# Patient Record
Sex: Male | Born: 1975
Health system: Southern US, Community
[De-identification: ages and names within clinical notes are randomized; demographics above are authoritative.]

## PROBLEM LIST (undated history)

## (undated) DIAGNOSIS — K219 Gastro-esophageal reflux disease without esophagitis: Secondary | ICD-10-CM

## (undated) DIAGNOSIS — K760 Fatty (change of) liver, not elsewhere classified: Secondary | ICD-10-CM

## (undated) DIAGNOSIS — Z87442 Personal history of urinary calculi: Secondary | ICD-10-CM

## (undated) DIAGNOSIS — I10 Essential (primary) hypertension: Secondary | ICD-10-CM

## (undated) DIAGNOSIS — G4730 Sleep apnea, unspecified: Secondary | ICD-10-CM

## (undated) HISTORY — PX: LEG SURGERY: SHX1003

## (undated) HISTORY — DX: Gastro-esophageal reflux disease without esophagitis: K21.9

---

## 2010-03-12 ENCOUNTER — Inpatient Hospital Stay (HOSPITAL_COMMUNITY)
Admission: EM | Admit: 2010-03-12 | Discharge: 2010-03-13 | Payer: Self-pay | Attending: Orthopedic Surgery | Admitting: Orthopedic Surgery

## 2010-03-12 ENCOUNTER — Emergency Department (HOSPITAL_COMMUNITY)
Admission: EM | Admit: 2010-03-12 | Discharge: 2010-03-12 | Disposition: A | Discharge status: Other Acute Inpatient Hospital | Payer: Self-pay | Source: Home / Self Care | Attending: Orthopedic Surgery | Admitting: Orthopedic Surgery

## 2010-06-05 LAB — GLUCOSE, CAPILLARY: Glucose-Capillary: 93 mg/dL (ref 70–99)

## 2010-06-05 LAB — POCT I-STAT, CHEM 8
Calcium, Ion: 1.12 mmol/L (ref 1.12–1.32)
Chloride: 102 mEq/L (ref 96–112)
HCT: 46 % (ref 39.0–52.0)
Potassium: 3.8 mEq/L (ref 3.5–5.1)
TCO2: 30 mmol/L (ref 0–100)

## 2010-07-12 ENCOUNTER — Ambulatory Visit (HOSPITAL_COMMUNITY)
Admission: RE | Admit: 2010-07-12 | Discharge: 2010-07-12 | Disposition: A | Discharge status: Home / Self Care | Payer: BC Managed Care – PPO | Source: Ambulatory Visit | Attending: Orthopedic Surgery | Admitting: Orthopedic Surgery

## 2010-07-12 DIAGNOSIS — M25676 Stiffness of unspecified foot, not elsewhere classified: Secondary | ICD-10-CM | POA: Insufficient documentation

## 2010-07-12 DIAGNOSIS — M6281 Muscle weakness (generalized): Secondary | ICD-10-CM | POA: Insufficient documentation

## 2010-07-12 DIAGNOSIS — M25673 Stiffness of unspecified ankle, not elsewhere classified: Secondary | ICD-10-CM | POA: Insufficient documentation

## 2010-07-12 DIAGNOSIS — IMO0001 Reserved for inherently not codable concepts without codable children: Secondary | ICD-10-CM | POA: Insufficient documentation

## 2010-07-12 DIAGNOSIS — R262 Difficulty in walking, not elsewhere classified: Secondary | ICD-10-CM | POA: Insufficient documentation

## 2010-07-12 DIAGNOSIS — M25579 Pain in unspecified ankle and joints of unspecified foot: Secondary | ICD-10-CM | POA: Insufficient documentation

## 2010-07-18 ENCOUNTER — Ambulatory Visit (HOSPITAL_COMMUNITY)
Admission: RE | Admit: 2010-07-18 | Discharge: 2010-07-18 | Disposition: A | Discharge status: Home / Self Care | Payer: BC Managed Care – PPO | Source: Ambulatory Visit | Attending: Family Medicine | Admitting: Family Medicine

## 2010-07-21 ENCOUNTER — Ambulatory Visit (HOSPITAL_COMMUNITY)
Admission: RE | Admit: 2010-07-21 | Discharge: 2010-07-21 | Disposition: A | Discharge status: Home / Self Care | Payer: BC Managed Care – PPO | Source: Ambulatory Visit | Attending: Family Medicine | Admitting: Family Medicine

## 2010-07-25 ENCOUNTER — Ambulatory Visit (HOSPITAL_COMMUNITY)
Admission: RE | Admit: 2010-07-25 | Discharge: 2010-07-25 | Disposition: A | Discharge status: Home / Self Care | Payer: BC Managed Care – PPO | Source: Ambulatory Visit | Attending: Orthopedic Surgery | Admitting: Orthopedic Surgery

## 2010-07-25 DIAGNOSIS — M25673 Stiffness of unspecified ankle, not elsewhere classified: Secondary | ICD-10-CM | POA: Insufficient documentation

## 2010-07-25 DIAGNOSIS — M25676 Stiffness of unspecified foot, not elsewhere classified: Secondary | ICD-10-CM | POA: Insufficient documentation

## 2010-07-25 DIAGNOSIS — IMO0001 Reserved for inherently not codable concepts without codable children: Secondary | ICD-10-CM | POA: Insufficient documentation

## 2010-07-25 DIAGNOSIS — R262 Difficulty in walking, not elsewhere classified: Secondary | ICD-10-CM | POA: Insufficient documentation

## 2010-07-25 DIAGNOSIS — M25579 Pain in unspecified ankle and joints of unspecified foot: Secondary | ICD-10-CM | POA: Insufficient documentation

## 2010-07-25 DIAGNOSIS — M6281 Muscle weakness (generalized): Secondary | ICD-10-CM | POA: Insufficient documentation

## 2010-07-28 ENCOUNTER — Ambulatory Visit (HOSPITAL_COMMUNITY): Payer: BC Managed Care – PPO | Admitting: Physical Therapy

## 2010-07-31 ENCOUNTER — Ambulatory Visit (HOSPITAL_COMMUNITY): Payer: BC Managed Care – PPO | Admitting: *Deleted

## 2010-08-02 ENCOUNTER — Ambulatory Visit (HOSPITAL_COMMUNITY)
Admission: RE | Admit: 2010-08-02 | Discharge: 2010-08-02 | Disposition: A | Discharge status: Home / Self Care | Payer: BC Managed Care – PPO | Source: Ambulatory Visit

## 2010-08-03 ENCOUNTER — Ambulatory Visit (HOSPITAL_COMMUNITY)
Admission: RE | Admit: 2010-08-03 | Discharge: 2010-08-03 | Disposition: A | Discharge status: Home / Self Care | Payer: BC Managed Care – PPO | Source: Ambulatory Visit | Attending: Family Medicine | Admitting: Family Medicine

## 2010-08-16 ENCOUNTER — Ambulatory Visit (HOSPITAL_COMMUNITY)
Admission: RE | Admit: 2010-08-16 | Discharge: 2010-08-16 | Disposition: A | Discharge status: Home / Self Care | Payer: BC Managed Care – PPO | Source: Ambulatory Visit | Attending: Family Medicine | Admitting: Family Medicine

## 2010-08-18 ENCOUNTER — Ambulatory Visit (HOSPITAL_COMMUNITY): Payer: BC Managed Care – PPO | Admitting: Physical Therapy

## 2010-08-22 ENCOUNTER — Ambulatory Visit (HOSPITAL_COMMUNITY)
Admission: RE | Admit: 2010-08-22 | Discharge: 2010-08-22 | Disposition: A | Discharge status: Home / Self Care | Payer: BC Managed Care – PPO | Source: Ambulatory Visit | Attending: Family Medicine | Admitting: Family Medicine

## 2010-08-25 ENCOUNTER — Ambulatory Visit (HOSPITAL_COMMUNITY): Payer: BC Managed Care – PPO | Admitting: Physical Therapy

## 2011-03-27 HISTORY — PX: COLONOSCOPY: SHX174

## 2015-07-04 ENCOUNTER — Encounter (HOSPITAL_COMMUNITY): Payer: Self-pay

## 2015-07-04 ENCOUNTER — Emergency Department (HOSPITAL_COMMUNITY): Payer: Self-pay

## 2015-07-04 ENCOUNTER — Emergency Department (HOSPITAL_COMMUNITY)
Admission: EM | Admit: 2015-07-04 | Discharge: 2015-07-04 | Discharge status: Against Medical Advice | Payer: Self-pay | Attending: Emergency Medicine | Admitting: Emergency Medicine

## 2015-07-04 DIAGNOSIS — R0789 Other chest pain: Secondary | ICD-10-CM | POA: Insufficient documentation

## 2015-07-04 LAB — CBC WITH DIFFERENTIAL/PLATELET
Basophils Absolute: 0 10*3/uL (ref 0.0–0.1)
Basophils Relative: 1 %
EOS ABS: 0.2 10*3/uL (ref 0.0–0.7)
Eosinophils Relative: 2 %
HCT: 46.5 % (ref 39.0–52.0)
HEMOGLOBIN: 15.6 g/dL (ref 13.0–17.0)
LYMPHS ABS: 2.5 10*3/uL (ref 0.7–4.0)
LYMPHS PCT: 39 %
MCH: 29.3 pg (ref 26.0–34.0)
MCHC: 33.5 g/dL (ref 30.0–36.0)
MCV: 87.2 fL (ref 78.0–100.0)
Monocytes Absolute: 0.6 10*3/uL (ref 0.1–1.0)
Monocytes Relative: 9 %
NEUTROS PCT: 49 %
Neutro Abs: 3.2 10*3/uL (ref 1.7–7.7)
Platelets: 189 10*3/uL (ref 150–400)
RBC: 5.33 MIL/uL (ref 4.22–5.81)
RDW: 13.1 % (ref 11.5–15.5)
WBC: 6.4 10*3/uL (ref 4.0–10.5)

## 2015-07-04 LAB — COMPREHENSIVE METABOLIC PANEL
ALK PHOS: 56 U/L (ref 38–126)
ALT: 27 U/L (ref 17–63)
AST: 24 U/L (ref 15–41)
Albumin: 4.6 g/dL (ref 3.5–5.0)
Anion gap: 8 (ref 5–15)
BUN: 17 mg/dL (ref 6–20)
CALCIUM: 9 mg/dL (ref 8.9–10.3)
CO2: 29 mmol/L (ref 22–32)
CREATININE: 1.06 mg/dL (ref 0.61–1.24)
Chloride: 102 mmol/L (ref 101–111)
GFR calc non Af Amer: >60 mL/min (ref >60)
Glucose, Bld: 97 mg/dL (ref 65–99)
Potassium: 3.6 mmol/L (ref 3.5–5.1)
SODIUM: 139 mmol/L (ref 135–145)
Total Bilirubin: 0.6 mg/dL (ref 0.3–1.2)
Total Protein: 7.4 g/dL (ref 6.5–8.1)

## 2015-07-04 LAB — D-DIMER, QUANTITATIVE (NOT AT ARMC): D DIMER QUANT: 0.31 ug{FEU}/mL (ref 0.00–0.50)

## 2015-07-04 LAB — TROPONIN I: Troponin I: <0.03 ng/mL (ref <0.031)

## 2015-07-04 MED ORDER — NITROGLYCERIN 2 % TD OINT
1.0000 [in_us] | TOPICAL_OINTMENT | Freq: Once | TRANSDERMAL | Status: AC
  Administered 2015-07-04: 1 [in_us] via TOPICAL
  Filled 2015-07-04: qty 1

## 2015-07-04 NOTE — ED Provider Notes (Signed)
CSN: [id number]     Arrival date & time 07/04/15  0111 History   First MD Initiated Contact with Patient 07/04/15 0250     Chief Complaint  Patient presents with  . Chest Pain     (Consider location/radiation/quality/duration/timing/severity/associated sxs/prior Treatment) HPI patient reports about 4 PM on he was at work he started getting a sharp chest pain and indicates his left chest that last a few seconds. He states it happens about every 25 minutes. He also states he has a tightness in his left neck and his shoulder area that has been constant. 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 He denies shortness of breath, nausea, vomiting, or diaphoresis. He states nothing he does makes the pain feel worse, nothing he does makes it feel better. He states he's never had it before. He states he is not under any extra stress. He states a maternal grandmother had cardiac stents.  Patient states he has a history of reflux which has been getting worse lately. He states he's been waking up with a migraine headache every day for the past several months. He has been taking Advil for that. He was advised to follow-up with his PCP about his headaches and to stop taking the Advil which will make his reflux symptoms worse.   PCP Dr Neita Carp  History reviewed. No pertinent past medical history. Past Surgical History  Procedure Laterality Date  . Leg surgery Left    No family history on file. Social History  Substance Use Topics  . Smoking status: Never Smoker   . Smokeless tobacco: None  . Alcohol Use: No  employed Lives with spouse  Review of Systems  All other systems reviewed and are negative.     Allergies  Iodine  Home Medications   Prior to Admission medications   Not on File   BP 124/86 mmHg  Pulse 54  Temp(Src) 99.2 F (37.3 C) (Oral)  Resp 17  Ht  (1.778 m)  Wt 190 lb (86.183 kg)  BMI 27.26 kg/m2  SpO2 99% Physical Exam    Constitutional: He is oriented to person, place, and time. He appears well-developed and well-nourished.  Non-toxic appearance. He does not appear ill. No distress.  HENT:  Head: Normocephalic and atraumatic.  Right Ear: External ear normal.  Left Ear: External ear normal.  Nose: Nose normal. No mucosal edema or rhinorrhea.  Mouth/Throat: Oropharynx is clear and moist and mucous membranes are normal. No dental abscesses or uvula swelling.  Eyes: Conjunctivae and EOM are normal. Pupils are equal, round, and reactive to light.  Neck: Normal range of motion and full passive range of motion without pain. Neck supple.  Cardiovascular: Normal rate, regular rhythm and normal heart sounds.  Exam reveals no gallop and no friction rub.   No murmur heard. Pulmonary/Chest: Effort normal and breath sounds normal. No respiratory distress. He has no wheezes. He has no rhonchi. He has no rales. He exhibits no tenderness and no crepitus.    Area of chest pain  Abdominal: Soft. Normal appearance and bowel sounds are normal. He exhibits no distension. There is tenderness in the left upper quadrant. There is no rebound and no guarding.    Also tender in the left upper quadrant  Musculoskeletal: Normal range of motion. He exhibits no edema or tenderness.  Moves all extremities well.   Neurological: He is alert and oriented to person, place, and time. He has normal strength. No cranial  nerve deficit.  Skin: Skin is warm, dry and intact. No rash noted. No erythema. No pallor.  Psychiatric: He has a normal mood and affect. His speech is normal and behavior is normal. His mood appears not anxious.  Nursing note and vitals reviewed.   ED Course  Procedures (including critical care time)  Medications  nitroGLYCERIN (NITROGLYN) 2 % ointment 1 inch (1 inch Topical Given 07/04/15 0328)   Patient had nitroglycerin placed on his chest. I added a d-dimer to his blood work. We discussed his blood test results, his  chest x-ray has not been resulted yet.  Nursing staff report patient signed out AMA.   Labs Review Results for orders placed or performed during the hospital encounter of 07/04/15  CBC with Differential  Result Value Ref Range   WBC 6.4 4.0 - 10.5 K/uL   RBC 5.33 4.22 - 5.81 MIL/uL   Hemoglobin 15.6 13.0 - 17.0 g/dL   HCT 95.246.5 84.139.0 - 32.452.0 %   MCV 87.2 78.0 - 100.0 fL   MCH 29.3 26.0 - 34.0 pg   MCHC 33.5 30.0 - 36.0 g/dL   RDW 40.113.1 02.711.5 - 25.315.5 %   Platelets 189 150 - 400 K/uL   Neutrophils Relative % 49 %   Neutro Abs 3.2 1.7 - 7.7 K/uL   Lymphocytes Relative 39 %   Lymphs Abs 2.5 0.7 - 4.0 K/uL   Monocytes Relative 9 %   Monocytes Absolute 0.6 0.1 - 1.0 K/uL   Eosinophils Relative 2 %   Eosinophils Absolute 0.2 0.0 - 0.7 K/uL   Basophils Relative 1 %   Basophils Absolute 0.0 0.0 - 0.1 K/uL  Comprehensive metabolic panel  Result Value Ref Range   Sodium 139 135 - 145 mmol/L   Potassium 3.6 3.5 - 5.1 mmol/L   Chloride 102 101 - 111 mmol/L   CO2 29 22 - 32 mmol/L   Glucose, Bld 97 65 - 99 mg/dL   BUN 17 6 - 20 mg/dL   Creatinine, Ser 6.641.06 0.61 - 1.24 mg/dL   Calcium 9.0 8.9 - 40.310.3 mg/dL   Total Protein 7.4 6.5 - 8.1 g/dL   Albumin 4.6 3.5 - 5.0 g/dL   AST 24 15 - 41 U/L   ALT 27 17 - 63 U/L   Alkaline Phosphatase 56 38 - 126 U/L   Total Bilirubin 0.6 0.3 - 1.2 mg/dL   GFR calc non Af Amer >60 >60 mL/min   GFR calc Af Amer >60 >60 mL/min   Anion gap 8 5 - 15  Troponin I  Result Value Ref Range   Troponin I <0.03 <0.031 ng/mL  D-dimer, quantitative  Result Value Ref Range   D-Dimer, Quant 0.31 0.00 - 0.50 ug/mL-FEU   Laboratory interpretation all normal      Imaging Review Dg Chest 2 View  07/04/2015  CLINICAL DATA:  Acute onset of chest tightness, extending to the jaw and shoulder. Initial encounter. EXAM: CHEST  2 VIEW COMPARISON:  Chest radiograph performed 03/12/2010 FINDINGS: The lungs are well-aerated and clear. There is no evidence of focal  opacification, pleural effusion or pneumothorax. The heart is normal in size; the mediastinal contour is within normal limits. No acute osseous abnormalities are seen. IMPRESSION: No acute cardiopulmonary process seen. Electronically Signed   By: Roanna RaiderJeffery  Chang M.D.   On: 07/04/2015 03:11   I have personally reviewed and evaluated these images and lab results as part of my medical decision-making.   EKG Interpretation   Date/Time:  Monday July 04 2015 01:21:55 EDT Ventricular Rate:  64 PR Interval:  154 QRS Duration: 100 QT Interval:  405 QTC Calculation: 418 R Axis:   16 Text Interpretation:  Sinus rhythm Abnormal R-wave progression, early  transition Baseline wander in lead(s) V5 Since last tracing rate slower  (12 Mar 2010) Confirmed by [facility]  MD-I, Eve Rey ([id number]) on 07/04/2015 3:02:35  AM      MDM   Final diagnoses:  Atypical chest pain   Pt left AMA  Devoria Albe, MD, Concha Pyo, MD 07/04/15 [phone]

## 2015-07-04 NOTE — ED Notes (Signed)
Around 4 pm yesterday I started getting a tightness in my chest.  Having a pulsing pain that would not go away.  I have been having a lot of indigestion over the past few weeks.  Tightness in my jaw and shoulder area.

## 2015-07-04 NOTE — ED Notes (Signed)
Pt and wife very upset with their care stating they will never come back to this hospital again. Pt and wife upset about wait times. Pt would not sign out AMA.

## 2015-07-04 NOTE — ED Notes (Signed)
Patient placed on cardiac monitoring, EKG done along with vitals.

## 2016-09-14 DIAGNOSIS — J028 Acute pharyngitis due to other specified organisms: Secondary | ICD-10-CM | POA: Diagnosis not present

## 2017-05-01 DIAGNOSIS — N451 Epididymitis: Secondary | ICD-10-CM | POA: Diagnosis not present

## 2017-05-07 ENCOUNTER — Other Ambulatory Visit (HOSPITAL_COMMUNITY): Payer: Self-pay | Admitting: Family Medicine

## 2017-05-07 DIAGNOSIS — N451 Epididymitis: Secondary | ICD-10-CM

## 2017-05-14 DIAGNOSIS — N50812 Left testicular pain: Secondary | ICD-10-CM | POA: Diagnosis not present

## 2017-05-14 DIAGNOSIS — N452 Orchitis: Secondary | ICD-10-CM | POA: Diagnosis not present

## 2017-05-14 DIAGNOSIS — R51 Headache: Secondary | ICD-10-CM | POA: Diagnosis not present

## 2017-09-16 ENCOUNTER — Emergency Department (HOSPITAL_COMMUNITY): Payer: Commercial Managed Care - PPO

## 2017-09-16 ENCOUNTER — Emergency Department (HOSPITAL_COMMUNITY)
Admission: EM | Admit: 2017-09-16 | Discharge: 2017-09-16 | Disposition: A | Discharge status: Home / Self Care | Payer: Commercial Managed Care - PPO | Attending: Emergency Medicine | Admitting: Emergency Medicine

## 2017-09-16 ENCOUNTER — Other Ambulatory Visit: Payer: Self-pay

## 2017-09-16 ENCOUNTER — Encounter (HOSPITAL_COMMUNITY): Payer: Self-pay | Admitting: *Deleted

## 2017-09-16 DIAGNOSIS — R12 Heartburn: Secondary | ICD-10-CM | POA: Diagnosis not present

## 2017-09-16 DIAGNOSIS — G4733 Obstructive sleep apnea (adult) (pediatric): Secondary | ICD-10-CM | POA: Diagnosis not present

## 2017-09-16 DIAGNOSIS — R1010 Upper abdominal pain, unspecified: Secondary | ICD-10-CM

## 2017-09-16 DIAGNOSIS — K5900 Constipation, unspecified: Secondary | ICD-10-CM | POA: Insufficient documentation

## 2017-09-16 DIAGNOSIS — R1033 Periumbilical pain: Secondary | ICD-10-CM | POA: Diagnosis present

## 2017-09-16 DIAGNOSIS — R319 Hematuria, unspecified: Secondary | ICD-10-CM | POA: Diagnosis not present

## 2017-09-16 DIAGNOSIS — K5730 Diverticulosis of large intestine without perforation or abscess without bleeding: Secondary | ICD-10-CM | POA: Diagnosis not present

## 2017-09-16 LAB — CBC WITH DIFFERENTIAL/PLATELET
Basophils Absolute: 0 10*3/uL (ref 0.0–0.1)
Basophils Relative: 0 %
EOS ABS: 0.1 10*3/uL (ref 0.0–0.7)
EOS PCT: 1 %
HEMATOCRIT: 46.1 % (ref 39.0–52.0)
HEMOGLOBIN: 15.5 g/dL (ref 13.0–17.0)
LYMPHS ABS: 1.7 10*3/uL (ref 0.7–4.0)
Lymphocytes Relative: 21 %
MCH: 29.8 pg (ref 26.0–34.0)
MCHC: 33.6 g/dL (ref 30.0–36.0)
MCV: 88.7 fL (ref 78.0–100.0)
MONO ABS: 1 10*3/uL (ref 0.1–1.0)
MONOS PCT: 12 %
Neutro Abs: 5.4 10*3/uL (ref 1.7–7.7)
Neutrophils Relative %: 66 %
Platelets: 181 10*3/uL (ref 150–400)
RBC: 5.2 MIL/uL (ref 4.22–5.81)
RDW: 13 % (ref 11.5–15.5)
WBC: 8.3 10*3/uL (ref 4.0–10.5)

## 2017-09-16 LAB — URINALYSIS, ROUTINE W REFLEX MICROSCOPIC
BACTERIA UA: NONE SEEN
Bilirubin Urine: NEGATIVE
GLUCOSE, UA: NEGATIVE mg/dL
Ketones, ur: NEGATIVE mg/dL
Leukocytes, UA: NEGATIVE
NITRITE: NEGATIVE
PH: 7 (ref 5.0–8.0)
PROTEIN: NEGATIVE mg/dL
SPECIFIC GRAVITY, URINE: 1.014 (ref 1.005–1.030)

## 2017-09-16 LAB — COMPREHENSIVE METABOLIC PANEL
ALK PHOS: 60 U/L (ref 38–126)
ALT: 22 U/L (ref 17–63)
ANION GAP: 6 (ref 5–15)
AST: 24 U/L (ref 15–41)
Albumin: 4.5 g/dL (ref 3.5–5.0)
BILIRUBIN TOTAL: 0.9 mg/dL (ref 0.3–1.2)
BUN: 22 mg/dL — ABNORMAL HIGH (ref 6–20)
CALCIUM: 9.9 mg/dL (ref 8.9–10.3)
CO2: 32 mmol/L (ref 22–32)
CREATININE: 1.66 mg/dL — AB (ref 0.61–1.24)
Chloride: 104 mmol/L (ref 101–111)
GFR calc Af Amer: 58 mL/min — ABNORMAL LOW (ref >60)
GFR calc non Af Amer: 50 mL/min — ABNORMAL LOW (ref >60)
GLUCOSE: 111 mg/dL — AB (ref 65–99)
Potassium: 3.6 mmol/L (ref 3.5–5.1)
SODIUM: 142 mmol/L (ref 135–145)
TOTAL PROTEIN: 7.3 g/dL (ref 6.5–8.1)

## 2017-09-16 LAB — LIPASE, BLOOD: Lipase: 33 U/L (ref 11–51)

## 2017-09-16 MED ORDER — FENTANYL CITRATE (PF) 100 MCG/2ML IJ SOLN
50.0000 ug | Freq: Once | INTRAMUSCULAR | Status: AC
  Administered 2017-09-16: 50 ug via INTRAVENOUS
  Filled 2017-09-16: qty 2

## 2017-09-16 MED ORDER — DICYCLOMINE HCL 10 MG/ML IM SOLN
20.0000 mg | Freq: Once | INTRAMUSCULAR | Status: AC
  Administered 2017-09-16: 20 mg via INTRAMUSCULAR
  Filled 2017-09-16: qty 2

## 2017-09-16 MED ORDER — KETOROLAC TROMETHAMINE 30 MG/ML IJ SOLN: 30.0000 mg | Freq: Once | INTRAMUSCULAR | Status: DC

## 2017-09-16 MED ORDER — SODIUM CHLORIDE 0.9 % IV BOLUS
1000.0000 mL | Freq: Once | INTRAVENOUS | Status: AC
Start: 2017-09-16 — End: 2017-09-16
  Administered 2017-09-16: 1000 mL via INTRAVENOUS

## 2017-09-16 MED ORDER — IOPAMIDOL (ISOVUE-300) INJECTION 61%
80.0000 mL | Freq: Once | INTRAVENOUS | Status: AC | PRN
  Administered 2017-09-16: 80 mL via INTRAVENOUS

## 2017-09-16 MED ORDER — ONDANSETRON HCL 4 MG/2ML IJ SOLN
4.0000 mg | Freq: Once | INTRAMUSCULAR | Status: AC
Start: 2017-09-16 — End: 2017-09-16
  Administered 2017-09-16: 4 mg via INTRAVENOUS
  Filled 2017-09-16: qty 2

## 2017-09-16 MED ORDER — ONDANSETRON HCL 4 MG PO TABS: 4.0000 mg | ORAL_TABLET | Freq: Three times a day (TID) | ORAL | 0 refills | Status: DC | PRN

## 2017-09-16 MED ORDER — SODIUM CHLORIDE 0.9 % IV BOLUS
1000.0000 mL | Freq: Once | INTRAVENOUS | Status: AC
  Administered 2017-09-16: 1000 mL via INTRAVENOUS

## 2017-09-16 MED ORDER — ONDANSETRON HCL 4 MG/2ML IJ SOLN
4.0000 mg | Freq: Once | INTRAMUSCULAR | Status: AC
  Administered 2017-09-16: 4 mg via INTRAVENOUS
  Filled 2017-09-16: qty 2

## 2017-09-16 MED ORDER — IOPAMIDOL (ISOVUE-300) INJECTION 61%: 100.0000 mL | Freq: Once | INTRAVENOUS | Status: DC | PRN

## 2017-09-16 NOTE — ED Provider Notes (Signed)
[facility] EMERGENCY DEPARTMENT Provider Note   CSN: [id number] Arrival date & time: 09/16/17  0022  Time seen 01:00 AM   History   Chief Complaint Chief Complaint  Patient presents with  . Abdominal Pain    HPI Robert Hunter is a 42 y.o. male.  HPI patient states tonight they ate barbecue ribs and steak fries.  He had taken a Prilosec an hour before hand.  By 8:30 PM he started having pain just above his umbilicus that radiates into the center of his back.  He states "I have pain in the gut".  He has said  this multiple times.  He has had nausea without vomiting.  He states he has been having acid reflux the past several days but not tonight.  He states he has never had this pain before.  He states he has been constipated all day and has felt the need to go but cannot.  He denies any prior surgeries.  He denies any family history of gallstones.  PCP Sasser, Clarene Critchley, MD   History reviewed. No pertinent past medical history.  There are no active problems to display for this patient.   Past Surgical History:  Procedure Laterality Date  . LEG SURGERY Left         Home Medications    prilosec PRN  Prior to Admission medications   Medication Sig Start Date End Date Taking? Authorizing Provider  ondansetron (ZOFRAN) 4 MG tablet Take 1 tablet (4 mg total) by mouth every 8 (eight) hours as needed. 09/16/17   Devoria Albe, MD    Family History No family history on file.  Social History Social History   Tobacco Use  . Smoking status: Never Smoker  . Smokeless tobacco: Never Used  Substance Use Topics  . Alcohol use: No  . Drug use: No  employed Lives with spouse   Allergies   Iodine   Review of Systems Review of Systems  All other systems reviewed and are negative.    Physical Exam Updated Vital Signs BP (!) 159/93   Pulse 70   Temp 99.1 F (37.3 C) (Oral)   Resp 16   Ht 5\' 10"  (1.778 m)   Wt 86.2 kg (190 lb)   SpO2 100%   BMI 27.26 kg/m    Vital signs normal    Physical Exam  Constitutional: He is oriented to person, place, and time. He appears well-developed and well-nourished.  Non-toxic appearance. He does not appear ill. No distress.  HENT:  Head: Normocephalic and atraumatic.  Right Ear: External ear normal.  Left Ear: External ear normal.  Nose: Nose normal. No mucosal edema or rhinorrhea.  Mouth/Throat: Oropharynx is clear and moist and mucous membranes are normal. No dental abscesses or uvula swelling.  Eyes: Pupils are equal, round, and reactive to light. Conjunctivae and EOM are normal.  Neck: Normal range of motion and full passive range of motion without pain. Neck supple.  Cardiovascular: Normal rate, regular rhythm and normal heart sounds. Exam reveals no gallop and no friction rub.  No murmur heard. Pulmonary/Chest: Effort normal and breath sounds normal. No respiratory distress. He has no wheezes. He has no rhonchi. He has no rales. He exhibits no tenderness and no crepitus.  Abdominal: Soft. Normal appearance and bowel sounds are normal. He exhibits no distension. There is tenderness in the epigastric area and periumbilical area. There is no rebound and no guarding.    Musculoskeletal: Normal range of motion. He exhibits no edema  or tenderness.  Moves all extremities well.   Neurological: He is alert and oriented to person, place, and time. He has normal strength. No cranial nerve deficit.  Skin: Skin is warm, dry and intact. No rash noted. No erythema. No pallor.  Psychiatric: He has a normal mood and affect. His speech is normal and behavior is normal. His mood appears not anxious.  Nursing note and vitals reviewed.    ED Treatments / Results  Labs (all labs ordered are listed, but only abnormal results are displayed) Results for orders placed or performed during the hospital encounter of 09/16/17  Comprehensive metabolic panel  Result Value Ref Range   Sodium 142 135 - 145 mmol/L   Potassium  3.6 3.5 - 5.1 mmol/L   Chloride 104 101 - 111 mmol/L   CO2 32 22 - 32 mmol/L   Glucose, Bld 111 (H) 65 - 99 mg/dL   BUN 22 (H) 6 - 20 mg/dL   Creatinine, Ser 7.821.66 (H) 0.61 - 1.24 mg/dL   Calcium 9.9 8.9 - 95.610.3 mg/dL   Total Protein 7.3 6.5 - 8.1 g/dL   Albumin 4.5 3.5 - 5.0 g/dL   AST 24 15 - 41 U/L   ALT 22 17 - 63 U/L   Alkaline Phosphatase 60 38 - 126 U/L   Total Bilirubin 0.9 0.3 - 1.2 mg/dL   GFR calc non Af Amer 50 (L) >60 mL/min   GFR calc Af Amer 58 (L) >60 mL/min   Anion gap 6 5 - 15  Lipase, blood  Result Value Ref Range   Lipase 33 11 - 51 U/L  CBC with Differential  Result Value Ref Range   WBC 8.3 4.0 - 10.5 K/uL   RBC 5.20 4.22 - 5.81 MIL/uL   Hemoglobin 15.5 13.0 - 17.0 g/dL   HCT 21.346.1 08.639.0 - 57.852.0 %   MCV 88.7 78.0 - 100.0 fL   MCH 29.8 26.0 - 34.0 pg   MCHC 33.6 30.0 - 36.0 g/dL   RDW 46.913.0 62.911.5 - 52.815.5 %   Platelets 181 150 - 400 K/uL   Neutrophils Relative % 66 %   Neutro Abs 5.4 1.7 - 7.7 K/uL   Lymphocytes Relative 21 %   Lymphs Abs 1.7 0.7 - 4.0 K/uL   Monocytes Relative 12 %   Monocytes Absolute 1.0 0.1 - 1.0 K/uL   Eosinophils Relative 1 %   Eosinophils Absolute 0.1 0.0 - 0.7 K/uL   Basophils Relative 0 %   Basophils Absolute 0.0 0.0 - 0.1 K/uL  Urinalysis, Routine w reflex microscopic  Result Value Ref Range   Color, Urine COLORLESS (A) YELLOW   APPearance CLEAR CLEAR   Specific Gravity, Urine 1.014 1.005 - 1.030   pH 7.0 5.0 - 8.0   Glucose, UA NEGATIVE NEGATIVE mg/dL   Hgb urine dipstick SMALL (A) NEGATIVE   Bilirubin Urine NEGATIVE NEGATIVE   Ketones, ur NEGATIVE NEGATIVE mg/dL   Protein, ur NEGATIVE NEGATIVE mg/dL   Nitrite NEGATIVE NEGATIVE   Leukocytes, UA NEGATIVE NEGATIVE   RBC / HPF 0-5 0 - 5 RBC/hpf   WBC, UA 0-5 0 - 5 WBC/hpf   Bacteria, UA NONE SEEN NONE SEEN   Laboratory interpretation all normal except mild renal insufficiency    EKG None  Radiology Ct Abdomen Pelvis W Contrast  Result Date: 09/16/2017 CLINICAL  DATA:  42 year old male with epigastric pain. EXAM: CT ABDOMEN AND PELVIS WITH CONTRAST TECHNIQUE: Multidetector CT imaging of the abdomen and pelvis was performed  using the standard protocol following bolus administration of intravenous contrast. CONTRAST:  80mL ISOVUE-300 IOPAMIDOL (ISOVUE-300) INJECTION 61% COMPARISON:  Abdominal radiograph dated 06/17/2011 FINDINGS: Lower chest: The visualized lung bases are clear. No intra-abdominal free air or free fluid. Hepatobiliary: No focal liver abnormality is seen. No gallstones, gallbladder wall thickening, or biliary dilatation. Pancreas: Unremarkable. No pancreatic ductal dilatation or surrounding inflammatory changes. Spleen: Normal in size without focal abnormality. Adrenals/Urinary Tract: The adrenal glands are unremarkable. There is mild haziness of the renal parenchyma and perinephric fat. Correlation with urinalysis recommended to exclude UTI. There is symmetric enhancement and excretion of contrast by both kidneys. The visualized ureters and urinary bladder appear unremarkable. Stomach/Bowel: There are scattered colonic diverticula without active inflammatory changes. There is no bowel obstruction. Normal appendix. Vascular/Lymphatic: No significant vascular findings are present. No enlarged abdominal or pelvic lymph nodes. Reproductive: The prostate and seminal vesicles are grossly unremarkable. Other: None Musculoskeletal: No acute or significant osseous findings. Multilevel Schmorl's nodes. IMPRESSION: 1. Mild haziness of the perinephric fat. Correlation with urinalysis recommended to exclude UTI. No hydronephrosis. 2. Colonic diverticulosis. No bowel obstruction or active inflammation. Normal appendix. Electronically Signed   By: Elgie Collard M.D.   On: 09/16/2017 02:58    Procedures Procedures (including critical care time)  Medications Ordered in ED Medications  ketorolac (TORADOL) 30 MG/ML injection 30 mg (has no administration in time  range)  sodium chloride 0.9 % bolus 1,000 mL (0 mLs Intravenous Stopped 09/16/17 0418)  sodium chloride 0.9 % bolus 1,000 mL (0 mLs Intravenous Stopped 09/16/17 0237)  fentaNYL (SUBLIMAZE) injection 50 mcg (50 mcg Intravenous Given 09/16/17 0122)  ondansetron (ZOFRAN) injection 4 mg (4 mg Intravenous Given 09/16/17 0122)  iopamidol (ISOVUE-300) 61 % injection 80 mL (80 mLs Intravenous Contrast Given 09/16/17 0218)  ondansetron (ZOFRAN) injection 4 mg (4 mg Intravenous Given 09/16/17 0237)  dicyclomine (BENTYL) injection 20 mg (20 mg Intramuscular Given 09/16/17 0328)     Initial Impression / Assessment and Plan / ED Course  I have reviewed the triage vital signs and the nursing notes.  Pertinent labs & imaging results that were available during my care of the patient were reviewed by me and considered in my medical decision making (see chart for details).   Laboratory testing was done and CT of the abdomen was done to look for possible acute pancreatitis, gallbladder disease, colitis.  Patient returned from CT scan with more nausea, Zofran was repeated.  2:42 AM patient and wife were informed his blood test were normal and we were waiting for the results of his CT scan.  3 AM patient continues to complain of nausea although his Zofran had been repeated.  He was given Bentyl IM.  They were given the results of his CT scan, his UA is still pending although when I look at the urine and his urine O it looks normal or clear.  Recheck at 4:15 AM patient states he is feeling a little better after the Bentyl.  He still complains of a lot of back pain.  He was given Toradol for his back pain.  I had like to the CT scan he does have a lot of stool in his lower intestines, we discussed doing laxatives.  Patient refused the Toradol and states he is ready to be discharged.  Final Clinical Impressions(s) / ED Diagnoses   Final diagnoses:  Pain of upper abdomen  Constipation, unspecified constipation type     ED Discharge Orders  Ordered    ondansetron (ZOFRAN) 4 MG tablet  Every 8 hours PRN     09/16/17 0419    OTC miralax  Plan discharge  Devoria Albe, MD, Concha Pyo, MD 09/16/17 [phone]

## 2017-09-16 NOTE — Discharge Instructions (Addendum)
Use the zofran for nausea or vomiting. Get miralax and put one dose or 17 g in 8 ounces of water,  take 1 dose every 30 minutes for 2-3 hours or until you  get good results and then once or twice daily to prevent constipation. Recheck if you get worse.

## 2017-09-16 NOTE — ED Triage Notes (Signed)
Pt c/o mid center abd pain that radiates to back area that started tonight,

## 2017-09-18 DIAGNOSIS — R1013 Epigastric pain: Secondary | ICD-10-CM | POA: Diagnosis not present

## 2017-09-18 DIAGNOSIS — K219 Gastro-esophageal reflux disease without esophagitis: Secondary | ICD-10-CM | POA: Diagnosis not present

## 2019-07-14 ENCOUNTER — Emergency Department (HOSPITAL_COMMUNITY): Payer: Commercial Managed Care - PPO

## 2019-07-14 ENCOUNTER — Other Ambulatory Visit: Payer: Self-pay

## 2019-07-14 ENCOUNTER — Encounter (HOSPITAL_COMMUNITY): Payer: Self-pay | Admitting: Emergency Medicine

## 2019-07-14 ENCOUNTER — Emergency Department (HOSPITAL_COMMUNITY)
Admission: EM | Admit: 2019-07-14 | Discharge: 2019-07-14 | Disposition: A | Discharge status: Home / Self Care | Payer: Commercial Managed Care - PPO | Attending: Emergency Medicine | Admitting: Emergency Medicine

## 2019-07-14 DIAGNOSIS — R1031 Right lower quadrant pain: Secondary | ICD-10-CM | POA: Diagnosis not present

## 2019-07-14 DIAGNOSIS — Z79899 Other long term (current) drug therapy: Secondary | ICD-10-CM | POA: Insufficient documentation

## 2019-07-14 DIAGNOSIS — M545 Low back pain: Secondary | ICD-10-CM | POA: Diagnosis present

## 2019-07-14 DIAGNOSIS — R1032 Left lower quadrant pain: Secondary | ICD-10-CM | POA: Insufficient documentation

## 2019-07-14 DIAGNOSIS — I10 Essential (primary) hypertension: Secondary | ICD-10-CM | POA: Insufficient documentation

## 2019-07-14 DIAGNOSIS — R109 Unspecified abdominal pain: Secondary | ICD-10-CM

## 2019-07-14 HISTORY — DX: Essential (primary) hypertension: I10

## 2019-07-14 LAB — URINALYSIS, ROUTINE W REFLEX MICROSCOPIC
Bacteria, UA: NONE SEEN
Bilirubin Urine: NEGATIVE
Glucose, UA: NEGATIVE mg/dL
Ketones, ur: NEGATIVE mg/dL
Leukocytes,Ua: NEGATIVE
Nitrite: NEGATIVE
Protein, ur: NEGATIVE mg/dL
Specific Gravity, Urine: 1.024 (ref 1.005–1.030)
pH: 7 (ref 5.0–8.0)

## 2019-07-14 LAB — BASIC METABOLIC PANEL
Anion gap: 10 (ref 5–15)
BUN: 11 mg/dL (ref 6–20)
CO2: 26 mmol/L (ref 22–32)
Calcium: 9 mg/dL (ref 8.9–10.3)
Chloride: 104 mmol/L (ref 98–111)
Creatinine, Ser: 0.93 mg/dL (ref 0.61–1.24)
GFR calc Af Amer: >60 mL/min (ref >60)
GFR calc non Af Amer: >60 mL/min (ref >60)
Glucose, Bld: 123 mg/dL — ABNORMAL HIGH (ref 70–99)
Potassium: 3.8 mmol/L (ref 3.5–5.1)
Sodium: 140 mmol/L (ref 135–145)

## 2019-07-14 LAB — CBC
HCT: 47.9 % (ref 39.0–52.0)
Hemoglobin: 15.5 g/dL (ref 13.0–17.0)
MCH: 28.5 pg (ref 26.0–34.0)
MCHC: 32.4 g/dL (ref 30.0–36.0)
MCV: 88.1 fL (ref 80.0–100.0)
Platelets: 151 10*3/uL (ref 150–400)
RBC: 5.44 MIL/uL (ref 4.22–5.81)
RDW: 12.5 % (ref 11.5–15.5)
WBC: 2 10*3/uL — ABNORMAL LOW (ref 4.0–10.5)
nRBC: 0 % (ref 0.0–0.2)

## 2019-07-14 MED ORDER — SODIUM CHLORIDE 0.9 % IV BOLUS
500.0000 mL | Freq: Once | INTRAVENOUS | Status: AC
  Administered 2019-07-14: 500 mL via INTRAVENOUS

## 2019-07-14 MED ORDER — KETOROLAC TROMETHAMINE 30 MG/ML IJ SOLN
15.0000 mg | Freq: Once | INTRAMUSCULAR | Status: AC
  Administered 2019-07-14: 21:00:00 15 mg via INTRAVENOUS
  Filled 2019-07-14: qty 1

## 2019-07-14 MED ORDER — ONDANSETRON 4 MG PO TBDP: 4.0000 mg | ORAL_TABLET | Freq: Three times a day (TID) | ORAL | 0 refills | Status: DC | PRN

## 2019-07-14 MED ORDER — OXYCODONE-ACETAMINOPHEN 5-325 MG PO TABS: 1.0000 | ORAL_TABLET | Freq: Four times a day (QID) | ORAL | 0 refills | Status: DC | PRN

## 2019-07-14 MED ORDER — TAMSULOSIN HCL 0.4 MG PO CAPS: 0.4000 mg | ORAL_CAPSULE | Freq: Every day | ORAL | 0 refills | Status: DC

## 2019-07-14 MED ORDER — ONDANSETRON HCL 4 MG/2ML IJ SOLN
4.0000 mg | Freq: Once | INTRAMUSCULAR | Status: AC
  Administered 2019-07-14: 4 mg via INTRAVENOUS
  Filled 2019-07-14: qty 2

## 2019-07-14 NOTE — ED Notes (Signed)
Pt st's he started having right flank pain this am  St's he vomited x's 2 due to the pain.   Family hx of kidney stones

## 2019-07-14 NOTE — ED Triage Notes (Signed)
Pt reports being covid + last week. Endorses right sided flank pain today. Pt threw up a few times today.

## 2019-07-14 NOTE — ED Provider Notes (Signed)
[facility] EMERGENCY DEPARTMENT Provider Note   CSN: [id number] Arrival date & time: 07/14/19  1108     History Chief Complaint  Patient presents with  . Flank Pain    Robert Hunter is a 44 y.o. male.  HPI      Robert Hunter is a 44 y.o. male, with a history of HTN, presenting to the ED with right lower back pain beginning this morning. Intermittent with left lower back and flank pain. Pain is sharp, waxing and waning, radiating inferiorly in the back, currently 9/10. Accompanied by nausea and vomiting today.  Body aches, fatigue, cough, and fever beginning Friday.  Was seen by his PCP and tested positive for Covid that same day.  Denies chest pain, shortness of breath, diarrhea, urinary symptoms, dizziness, syncope, or any other complaints.   Past Medical History:  Diagnosis Date  . Hypertension     There are no problems to display for this patient.   Past Surgical History:  Procedure Laterality Date  . LEG SURGERY Left        No family history on file.  Social History   Tobacco Use  . Smoking status: Never Smoker  . Smokeless tobacco: Never Used  Substance Use Topics  . Alcohol use: No  . Drug use: No    Home Medications Prior to Admission medications   Medication Sig Start Date End Date Taking? Authorizing Provider  acetaminophen (TYLENOL) 500 MG tablet Take 1,000 mg by mouth every 4 (four) hours as needed for mild pain or fever (and/or body aches).   Yes [provider]  doxycycline (VIBRA-TABS) 100 MG tablet Take 100 mg by mouth 2 (two) times daily. FOR 7 DAYS 06/30/19  Yes [provider]  lisinopril (ZESTRIL) 5 MG tablet Take 5 mg by mouth daily. 06/30/19  Yes [provider]  Multiple Vitamins-Minerals (IMMUNE SUPPORT VITAMIN C) PACK Take 1 packet by mouth See admin instructions. Assorted vitamin pack (D-3, Vitamin C, Zinc, Vitamin B-12): Take 1 packet by mouth once a day with food   Yes [provider]  ondansetron (ZOFRAN ODT) 4 MG disintegrating tablet Take 1 tablet (4 mg total) by mouth every 8 (eight) hours as needed for nausea or vomiting. 07/14/19   Davyn Elsasser C, PA-C  ondansetron (ZOFRAN) 4 MG tablet Take 1 tablet (4 mg total) by mouth every 8 (eight) hours as needed. Patient not taking: Reported on 07/14/2019 09/16/17   Devoria Albe, MD  oxyCODONE-acetaminophen (PERCOCET/ROXICET) 5-325 MG tablet Take 1-2 tablets by mouth every 6 (six) hours as needed for severe pain. 07/14/19   Vyla Pint C, PA-C  tamsulosin (FLOMAX) 0.4 MG CAPS capsule Take 1 capsule (0.4 mg total) by mouth daily. 07/14/19   Shakeya Kerkman C, PA-C    Allergies    Iodine  Review of Systems   Review of Systems  Constitutional: Negative for chills and fever.  Respiratory: Negative for shortness of breath.   Gastrointestinal: Positive for nausea and vomiting. Negative for abdominal pain, blood in stool and diarrhea.  Genitourinary: Negative for difficulty urinating, dysuria, frequency, hematuria and testicular pain.  Musculoskeletal: Positive for myalgias.  Neurological: Negative for dizziness, syncope and weakness.  All other systems reviewed and are negative.   Physical Exam Updated Vital Signs BP (!) 120/92   Pulse 93   Temp 99.2 F (37.3 C) (Oral)   Resp 18   Ht 5\' 10"  (1.778 m)   Wt 86.2 kg   SpO2 96%   BMI  27.26 kg/m   Physical Exam Vitals and nursing note reviewed.  Constitutional:      General: He is not in acute distress.    Appearance: He is well-developed. He is not diaphoretic.  HENT:     Head: Normocephalic and atraumatic.     Mouth/Throat:     Mouth: Mucous membranes are moist.     Pharynx: Oropharynx is clear.  Eyes:     Conjunctiva/sclera: Conjunctivae normal.  Cardiovascular:     Rate and Rhythm: Normal rate and regular rhythm.     Pulses: Normal pulses.          Radial pulses are 2+ on the right side and 2+ on the left side.       Posterior tibial pulses are 2+ on the  right side and 2+ on the left side.     Heart sounds: Normal heart sounds.     Comments: Tactile temperature in the extremities appropriate and equal bilaterally. Pulmonary:     Effort: Pulmonary effort is normal. No respiratory distress.     Breath sounds: Normal breath sounds.  Abdominal:     Palpations: Abdomen is soft.     Tenderness: There is no abdominal tenderness. There is right CVA tenderness and left CVA tenderness. There is no guarding.     Comments: CVA tenderness worse on the right.  Musculoskeletal:     Cervical back: Neck supple.     Right lower leg: No edema.     Left lower leg: No edema.  Lymphadenopathy:     Cervical: No cervical adenopathy.  Skin:    General: Skin is warm and dry.  Neurological:     Mental Status: He is alert.  Psychiatric:        Mood and Affect: Mood and affect normal.        Speech: Speech normal.        Behavior: Behavior normal.     ED Results / Procedures / Treatments   Labs (all labs ordered are listed, but only abnormal results are displayed) Labs Reviewed  URINALYSIS, ROUTINE W REFLEX MICROSCOPIC - Abnormal; Notable for the following components:      Result Value   APPearance HAZY (*)    Hgb urine dipstick SMALL (*)    All other components within normal limits  CBC - Abnormal; Notable for the following components:   WBC 2.0 (*)    All other components within normal limits  BASIC METABOLIC PANEL - Abnormal; Notable for the following components:   Glucose, Bld 123 (*)    All other components within normal limits  URINE CULTURE    EKG None  Radiology DG Chest Portable 1 View  Result Date: 07/14/2019 CLINICAL DATA:  Right-sided back pain chest pain EXAM: PORTABLE CHEST 1 VIEW COMPARISON:  07/04/2015 FINDINGS: The heart size and mediastinal contours are within normal limits. Both lungs are clear. The visualized skeletal structures are unremarkable. IMPRESSION: No active disease. Electronically Signed   By: Donavan Foil M.D.    On: 07/14/2019 19:01   CT Renal Stone Study  Result Date: 07/14/2019 CLINICAL DATA:  44 year old male with flank pain. EXAM: CT ABDOMEN AND PELVIS WITHOUT CONTRAST TECHNIQUE: Multidetector CT imaging of the abdomen and pelvis was performed following the standard protocol without IV contrast. COMPARISON:  CT abdomen pelvis dated 09/16/2017. FINDINGS: Evaluation of this exam is limited in the absence of intravenous contrast. Lower chest: The visualized lung bases are clear. No intra-abdominal free air or free fluid. Hepatobiliary: Mild  fatty infiltration of the liver. No intrahepatic biliary ductal dilatation. The gallbladder is unremarkable. Pancreas: Unremarkable. No pancreatic ductal dilatation or surrounding inflammatory changes. Spleen: Normal in size without focal abnormality. Adrenals/Urinary Tract: The adrenal glands are unremarkable. There is no hydronephrosis or nephrolithiasis on either side. There is a 2 mm stone in the distal left ureter adjacent to the left ureterovesical junction. The right ureter and urinary bladder appear unremarkable. Stomach/Bowel: Small scattered colonic diverticula without active inflammatory changes. There is no bowel obstruction or active inflammation. The appendix is normal. Vascular/Lymphatic: The abdominal aorta and IVC are unremarkable. No portal venous gas. There is no adenopathy. Reproductive: The prostate and seminal vesicles are grossly unremarkable. Other: None Musculoskeletal: No acute or significant osseous findings. IMPRESSION: 1. A 2 mm distal left ureteral stone. No hydronephrosis or intrarenal stone. 2. Mild fatty liver. 3. Small scattered colonic diverticula. No bowel obstruction. Normal appendix. Electronically Signed   By: Elgie Collard M.D.   On: 07/14/2019 21:12    Procedures Procedures (including critical care time)  Medications Ordered in ED Medications  ondansetron [facility]) injection 4 mg (4 mg Intravenous Given 07/14/19 2038)  sodium  chloride 0.9 % bolus 500 mL (0 mLs Intravenous Stopped 07/14/19 2311)  ketorolac (TORADOL) 30 MG/ML injection 15 mg (15 mg Intravenous Given 07/14/19 2039)    ED Course  I have reviewed the triage vital signs and the nursing notes.  Pertinent labs & imaging results that were available during my care of the patient were reviewed by me and considered in my medical decision making (see chart for details).  Clinical Course as of Jul 15 26  Tue Jul 14, 2019  1548 Patient not yet in the room.   [SJ]  2202 Actively working with his PCP on this matter.   BP(!): 145/95 [SJ]    Clinical Course User Index [SJ] Malayiah Mcbrayer, Hillard Danker, PA-C   MDM Rules/Calculators/A&P                      Patient presents with flank pain beginning today. Patient is nontoxic appearing, afebrile, not tachycardic upon my exam, not tachypneic, not hypotensive, maintains excellent SPO2 on room air, and is in no apparent distress.   I have reviewed the patient's chart to obtain more information.   I reviewed and interpreted the patient's labs and radiological studies. He does have some leukopenia, which has been found in many Covid patients.  Lab results otherwise reassuring.  No acute abnormality on chest x-ray. Patient states his pain began to improve prior to the Toradol, but then completely resolved after the Toradol. CT with definite ureteral stone on the left.  No evidence of stone on the right, however, due to the improvement in patient's pain prior to Toradol, patient may have had a stone on the right that passed. No evidence of urinary infection.  No abnormalities to the patient's BUN or creatinine. My suspicion for other pathology, such as infarct is low based on the consideration of all of the above.  He will follow-up with his PCP. The patient was given instructions for home care as well as return precautions. Patient voices understanding of these instructions, accepts the plan, and is comfortable with  discharge.   Findings and plan of care discussed with Arby Barrette, MD.   Vitals:   07/14/19 1135 07/14/19 1341 07/14/19 2041  BP: (!) 144/96 (!) 120/92 (!) 145/95  Pulse: (!) 106 93 84  Resp: 20 18 (!) 22  Temp: 99  F (37.2 C) 99.2 F (37.3 C) 99.7 F (37.6 C)  TempSrc: Oral Oral Oral  SpO2: 99% 96% 99%  Weight: 86.2 kg    Height: 5\' 10"  (1.778 m)        Final Clinical Impression(s) / ED Diagnoses Final diagnoses:  Flank pain    Rx / DC Orders ED Discharge Orders         Ordered    oxyCODONE-acetaminophen (PERCOCET/ROXICET) 5-325 MG tablet  Every 6 hours PRN     07/14/19 2221    ondansetron (ZOFRAN ODT) 4 MG disintegrating tablet  Every 8 hours PRN     07/14/19 2221    tamsulosin (FLOMAX) 0.4 MG CAPS capsule  Daily     07/14/19 2222           2223 07/15/19 0031    07/17/19, MD 07/20/19 2227

## 2019-07-14 NOTE — ED Notes (Signed)
Patient back from CT.

## 2019-07-14 NOTE — Discharge Instructions (Addendum)
  Kidney Stone There is evidence of a kidney stone on the left side.  It appears as though it is on its way out.  Some kidney stones can take up to 30 days to pass. Hydration: Hydration is key to helping a kidney stone pass.  Have a goal of half a liter of water every hour or two. Antiinflammatory medications: Take 600 mg of ibuprofen every 6 hours or 440 mg (over the counter dose) to 500 mg (prescription dose) of naproxen every 12 hours for the next 3 days. After this time, these medications may be used as needed for pain. Take these medications with food to avoid upset stomach. Choose only one of these medications, do not take them together. Acetaminophen: Should you continue to have additional pain while taking the ibuprofen or naproxen, you may add in acetaminophen (generic for Tylenol) as needed. Your daily total maximum amount of acetaminophen from all sources should be limited to 4000mg /day for persons without liver problems, or 2000mg /day for those with liver problems. Percocet: May take Percocet (oxycodone-acetaminophen) as needed for severe pain.   Do not drive or perform other dangerous activities while taking this medication as it can cause drowsiness as well as changes in reaction time and judgement.  Please note that each pill of Percocet contains 325 mg of acetaminophen (generic for Tylenol) and the above dosage limits apply. Tamsulosin: This medication is designed to help the stone pass.  Take this medication daily until stone passes. Nausea/vomiting: Use the ondansetron (generic for Zofran) for nausea or vomiting.  This medication may not prevent all vomiting or nausea, but can help facilitate better hydration. Things that can help with nausea/vomiting also include peppermint/menthol candies, vitamin B12, and ginger. Follow-up: Follow-up with the urologist as soon as possible on this matter.  Follow-up with the urologist or primary care provider for recurrence of right flank  pain. Return: Return to the ED for significantly increased pain, difficulty urinating, pain with urination, fever, uncontrolled vomiting, or any other major concerns.  For prescription assistance, may try using prescription discount sites or apps, such as goodrx.com or Good Rx smart phone app.

## 2020-11-13 IMAGING — CT CT RENAL STONE PROTOCOL
2 of 4 series · 16 of 46 positions shown, 18 images · non-contrast
Comparison: CT abdomen pelvis dated 09/16/2017.

CLINICAL DATA: 44-year-old male with flank pain.

EXAM:
CT ABDOMEN AND PELVIS WITHOUT CONTRAST
TECHNIQUE: Multidetector CT imaging of the abdomen and pelvis was performed
following the standard protocol without IV contrast.

[Series 3: stone study 5.0 i30f 2 · axial · 0.81mm/px · z∈[-1339,-879]mm · 13 of 100 slices shown, 15 images]
[im 4/100  soft-tissue]
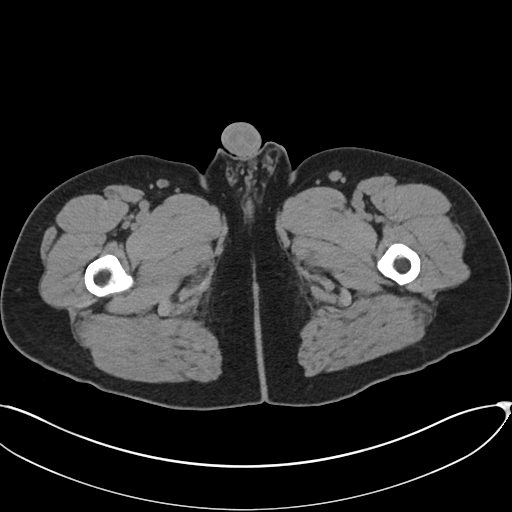
[im 4/100  bone]
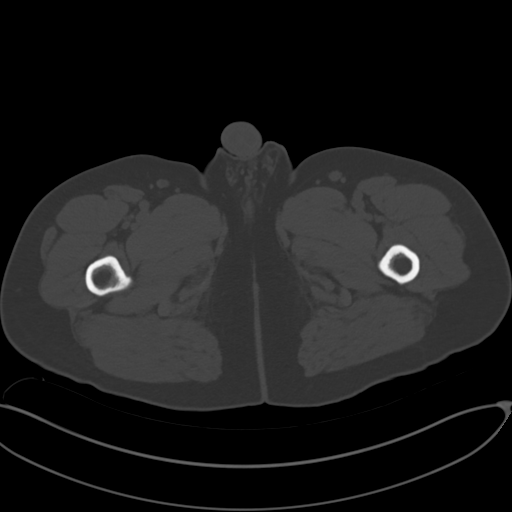
[im 12/100  soft-tissue]
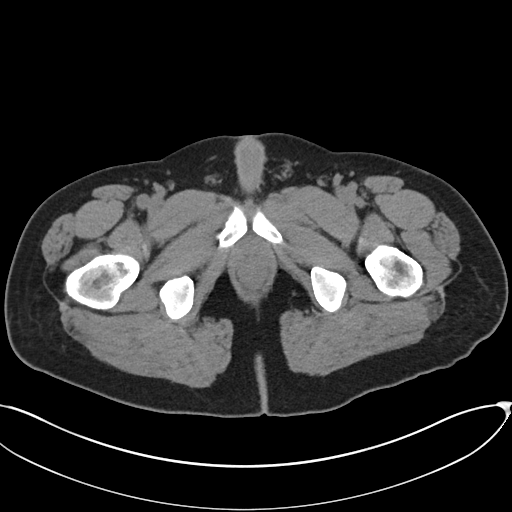
[im 20/100  soft-tissue]
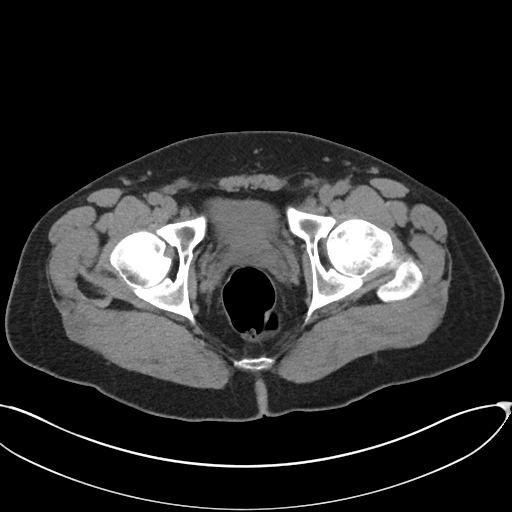
[im 28/100  soft-tissue]
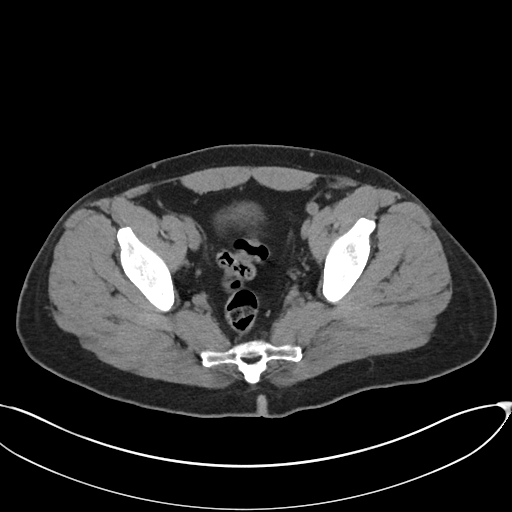
[im 36/100  soft-tissue]
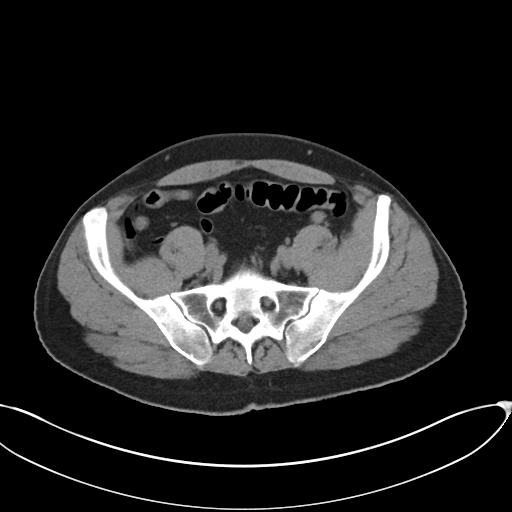
[im 44/100  soft-tissue]
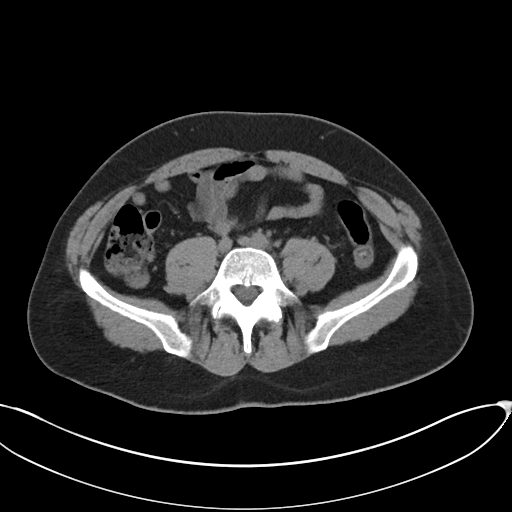
[im 52/100  soft-tissue]
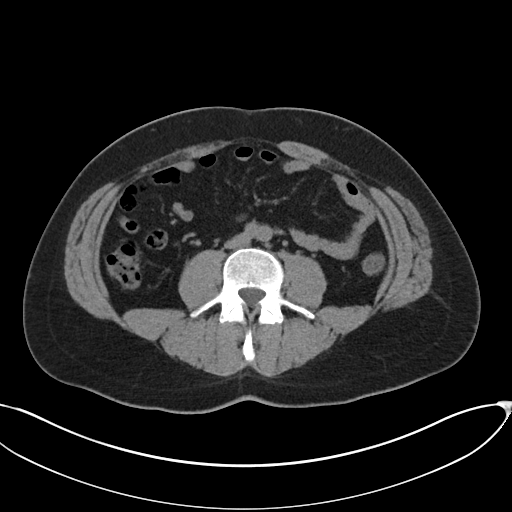
[im 56/100  soft-tissue]
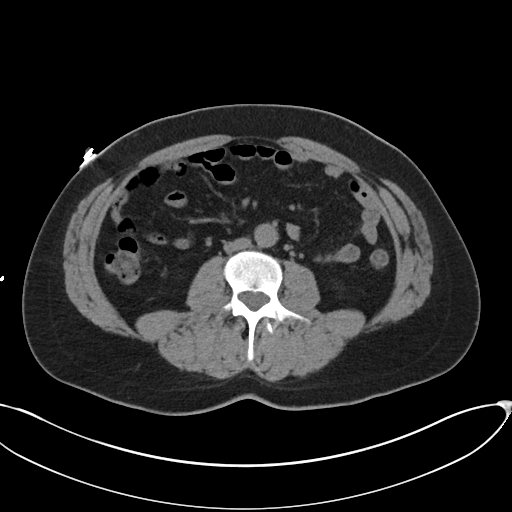
[im 64/100  soft-tissue]
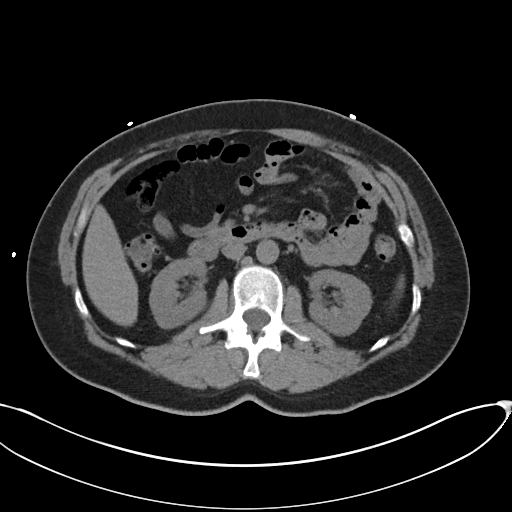
[im 64/100  bone]
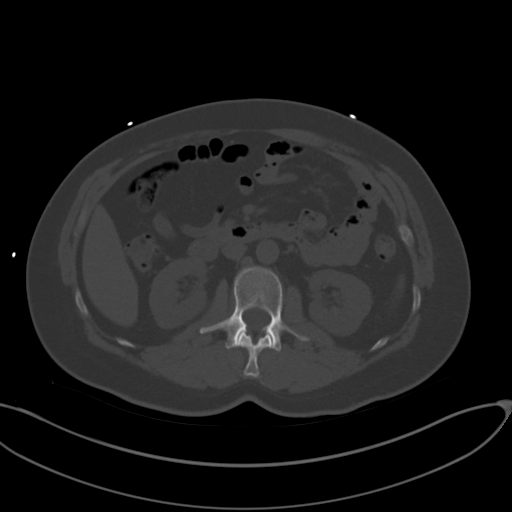
[im 72/100  soft-tissue]
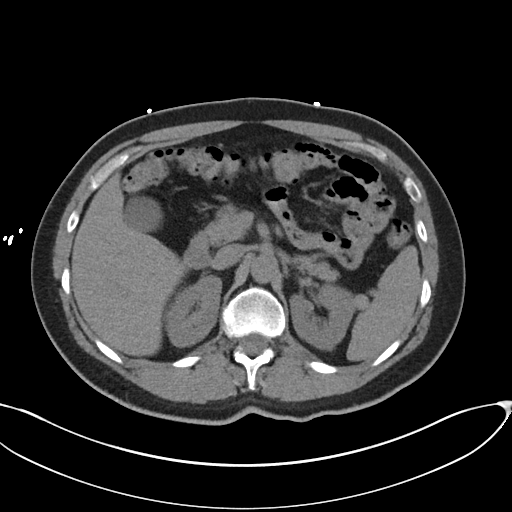
[im 80/100  soft-tissue]
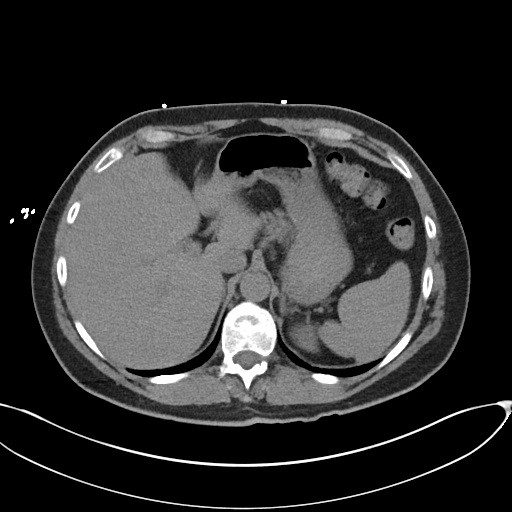
[im 88/100  soft-tissue]
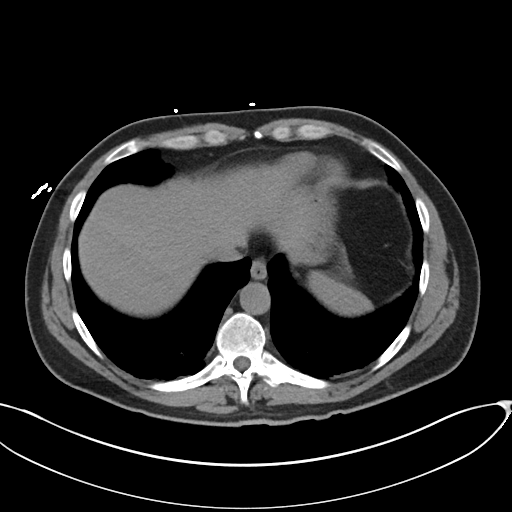
[im 96/100  soft-tissue]
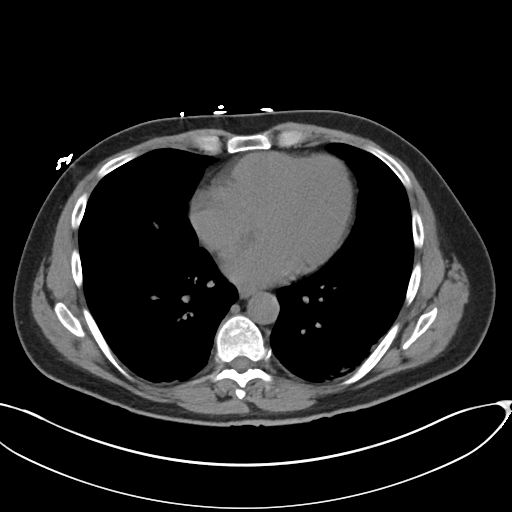

[Series 6: coronal soft tissue · coronal · 0.86mm/px · 3 of 101 slices shown]
[im 34/101  soft-tissue]
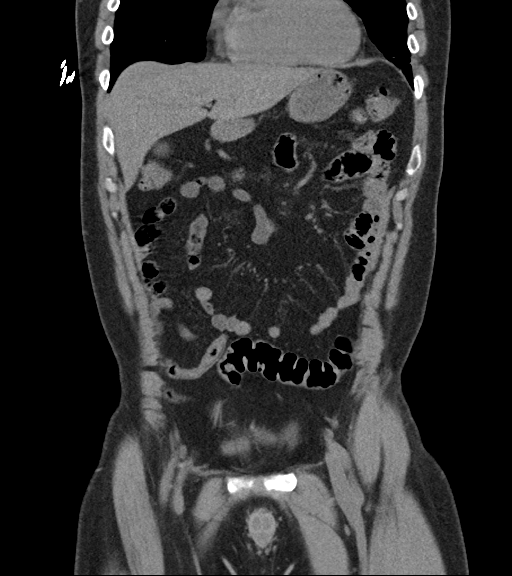
[im 45/101  soft-tissue]
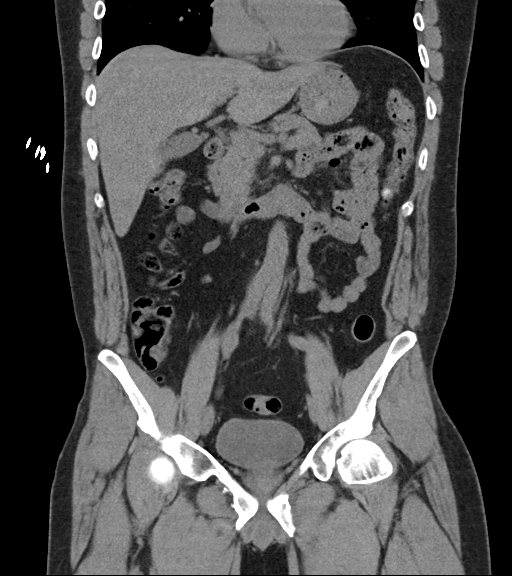
[im 56/101  soft-tissue]
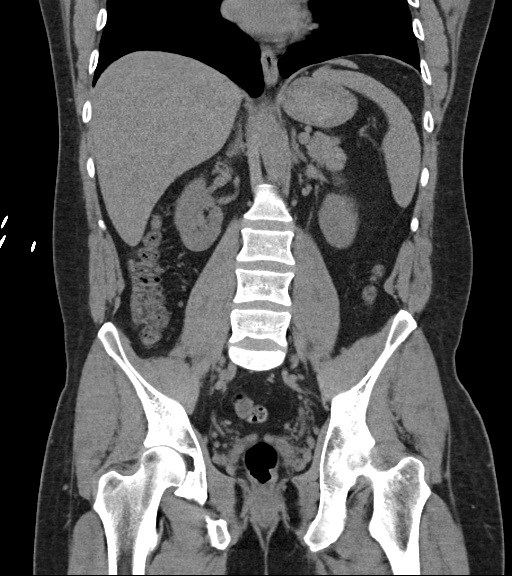

[16 of 46 positions shown; findings below may reference images not displayed]

FINDINGS: Evaluation of this exam is limited in the absence of intravenous
contrast.

Lower chest: The visualized lung bases are clear.

No intra-abdominal free air or free fluid.

Hepatobiliary: Mild fatty infiltration of the liver. No intrahepatic
biliary ductal dilatation. The gallbladder is unremarkable.

Pancreas: Unremarkable. No pancreatic ductal dilatation or
surrounding inflammatory changes.

Spleen: Normal in size without focal abnormality.

Adrenals/Urinary Tract: The adrenal glands are unremarkable. There
is no hydronephrosis or nephrolithiasis on either side. There is a 2
mm stone in the distal left ureter adjacent to the left
ureterovesical junction. The right ureter and urinary bladder appear
unremarkable.

Stomach/Bowel: Small scattered colonic diverticula without active
inflammatory changes. There is no bowel obstruction or active
inflammation. The appendix is normal.

Vascular/Lymphatic: The abdominal aorta and IVC are unremarkable. No
portal venous gas. There is no adenopathy.

Reproductive: The prostate and seminal vesicles are grossly
unremarkable.

Other: None

Musculoskeletal: No acute or significant osseous findings.
IMPRESSION: 1. A 2 mm distal left ureteral stone. No hydronephrosis or
intrarenal stone.
2. Mild fatty liver.
3. Small scattered colonic diverticula. No bowel obstruction. Normal
appendix.

## 2021-11-15 DIAGNOSIS — Z6829 Body mass index (BMI) 29.0-29.9, adult: Secondary | ICD-10-CM | POA: Diagnosis not present

## 2021-11-15 DIAGNOSIS — D179 Benign lipomatous neoplasm, unspecified: Secondary | ICD-10-CM | POA: Diagnosis not present

## 2021-11-15 DIAGNOSIS — G4730 Sleep apnea, unspecified: Secondary | ICD-10-CM | POA: Diagnosis not present

## 2022-01-13 DIAGNOSIS — G4733 Obstructive sleep apnea (adult) (pediatric): Secondary | ICD-10-CM | POA: Diagnosis not present

## 2022-02-13 DIAGNOSIS — G4733 Obstructive sleep apnea (adult) (pediatric): Secondary | ICD-10-CM | POA: Diagnosis not present

## 2022-03-15 DIAGNOSIS — G4733 Obstructive sleep apnea (adult) (pediatric): Secondary | ICD-10-CM | POA: Diagnosis not present

## 2022-04-15 DIAGNOSIS — G4733 Obstructive sleep apnea (adult) (pediatric): Secondary | ICD-10-CM | POA: Diagnosis not present

## 2022-05-16 DIAGNOSIS — G4733 Obstructive sleep apnea (adult) (pediatric): Secondary | ICD-10-CM | POA: Diagnosis not present

## 2022-07-07 DIAGNOSIS — R1013 Epigastric pain: Secondary | ICD-10-CM | POA: Diagnosis not present

## 2022-07-07 DIAGNOSIS — Z6831 Body mass index (BMI) 31.0-31.9, adult: Secondary | ICD-10-CM | POA: Diagnosis not present

## 2022-07-07 DIAGNOSIS — D179 Benign lipomatous neoplasm, unspecified: Secondary | ICD-10-CM | POA: Diagnosis not present

## 2022-07-07 DIAGNOSIS — I10 Essential (primary) hypertension: Secondary | ICD-10-CM | POA: Diagnosis not present

## 2022-07-13 DIAGNOSIS — R1013 Epigastric pain: Secondary | ICD-10-CM | POA: Diagnosis not present

## 2022-07-15 DIAGNOSIS — G4733 Obstructive sleep apnea (adult) (pediatric): Secondary | ICD-10-CM | POA: Diagnosis not present

## 2022-07-18 ENCOUNTER — Ambulatory Visit: Payer: BC Managed Care – PPO | Admitting: Gastroenterology

## 2022-07-25 ENCOUNTER — Encounter: Payer: Self-pay | Admitting: Gastroenterology

## 2022-07-25 ENCOUNTER — Ambulatory Visit: Payer: BC Managed Care – PPO | Admitting: Gastroenterology

## 2022-07-25 VITALS — BP 149/97 | HR 72 | Temp 98.3°F | Ht 69.0 in | Wt 201.4 lb

## 2022-07-25 DIAGNOSIS — K219 Gastro-esophageal reflux disease without esophagitis: Secondary | ICD-10-CM

## 2022-07-25 DIAGNOSIS — R198 Other specified symptoms and signs involving the digestive system and abdomen: Secondary | ICD-10-CM | POA: Diagnosis not present

## 2022-07-25 DIAGNOSIS — K760 Fatty (change of) liver, not elsewhere classified: Secondary | ICD-10-CM | POA: Diagnosis not present

## 2022-07-25 DIAGNOSIS — R1013 Epigastric pain: Secondary | ICD-10-CM

## 2022-07-25 NOTE — Progress Notes (Signed)
GI Office Note    Referring Provider: Richardean Chimera, MD Primary Care Physician:  Richardean Chimera, MD  Primary Gastroenterologist: Roetta Sessions, MD   Chief Complaint   Chief Complaint  Patient presents with   Abdominal Pain    Sharp pains after eating     History of Present Illness   Robert Hunter is a 47 y.o. male presenting today at the request of Roma Kayser, PA-C for further evaluation of abdominal pain.  Patient complains of pain in the epigastric region, symptoms postprandial. Typically occurring after meals, lasting for about an hour. Can be intense pain. Seems to happen with heavier meals. No pain in between episodes. When symptoms first started he also noted bad acid reflux. He has ran out of his PPI. He used Pepcid AC 3-4 nights until he could get refill. Since back on pantoprazole his heartburn is improving. The last 2-3 days, he has been trying to eat better. He is still having abdominal pain after meals but not every time he eats. He has also had change in bowels during this time. Stools are thin and chopping ([place] 5). He has had issues with his hemorrhoids lately, has creams and suppositories but they don't really help. No brbpr. He had some melena the first week of his symptoms but stools are normal color now.     Ibuprofen for migraines, typically 400mg . Some weeks has to take daily. At other times just a couple of times per week. He has also used BC powders if ibuprofen does not help his headaches.   Recent u/s with fatty liver. No gallstones or gallbladder disease. States he is at his heaviest weight right now. He has a cousin with fatty liver. Two family members with cirrhosis but they abused alcohol. Patient does not drink etoh.   Labs from July 07, 2022: Glucose 96, creatinine 0.95, sodium 142, albumin 4.8, total bilirubin 0.4, alk phos 78, AST 22, ALT 24, white blood cell count 4500, hemoglobin 15.7, platelets 207,000, lipase 28  Abdominal  ultrasound July 13, 2022: Hepatic steatosis.  No prior EGD.   Report colonoscopy about 10 years ago at [facility].  Medications   Current Outpatient Medications  Medication Sig Dispense Refill   acetaminophen (TYLENOL) 500 MG tablet Take 1,000 mg by mouth every 4 (four) hours as needed for mild pain or fever (and/or body aches).     lisinopril (ZESTRIL) 5 MG tablet Take 5 mg by mouth daily.     pantoprazole (PROTONIX) 40 MG tablet Take 40 mg by mouth daily.     No current facility-administered medications for this visit.    Allergies   Allergies as of 07/25/2022 - Review Complete 07/25/2022  Allergen Reaction Noted   Iodine Rash 07/04/2015    Past Medical History   Past Medical History:  Diagnosis Date   GERD (gastroesophageal reflux disease)    Hypertension     Past Surgical History   Past Surgical History:  Procedure Laterality Date   LEG SURGERY Left     Past Family History   Family History  Problem Relation Age of Onset   Liver disease Cousin    Cirrhosis Cousin        etoh, maternal   Colon cancer Other        paternal great uncle in his 1s   Cirrhosis Maternal Aunt        etoh   Inflammatory bowel disease Neg Hx    Celiac disease Neg Hx  Pancreatic disease Neg Hx     Past Social History   Social History   Socioeconomic History   Marital status: Married    Spouse name: Not on file   Number of children: Not on file   Years of education: Not on file   Highest education level: Not on file  Occupational History   Not on file  Tobacco Use   Smoking status: Never   Smokeless tobacco: Never  Substance and Sexual Activity   Alcohol use: No   Drug use: No   Sexual activity: Yes  Other Topics Concern   Not on file  Social History Narrative   Not on file   Social Determinants of Health   Financial Resource Strain: Not on file  Food Insecurity: Not on file  Transportation Needs: Not on file  Physical Activity: Not on file  Stress: Not on  file  Social Connections: Not on file  Intimate Partner Violence: Not on file    Review of Systems   General: Negative for anorexia, weight loss, fever, chills, fatigue, weakness. Eyes: Negative for vision changes.  ENT: Negative for hoarseness, difficulty swallowing , nasal congestion. CV: Negative for chest pain, angina, palpitations, dyspnea on exertion, peripheral edema.  Respiratory: Negative for dyspnea at rest, dyspnea on exertion, cough, sputum, wheezing.  GI: See history of present illness. GU:  Negative for dysuria, hematuria, urinary incontinence, urinary frequency, nocturnal urination.  MS: Negative for joint pain, low back pain.  Derm: Negative for rash or itching.  Neuro: Negative for weakness, abnormal sensation, seizure, frequent headaches, memory loss,  confusion.  Psych: Negative for anxiety, depression, suicidal ideation, hallucinations.  Endo: Negative for unusual weight change.  Heme: Negative for bruising or bleeding. Allergy: Negative for rash or hives.  Physical Exam   BP (!) 150/90 (BP Location: Right Arm, Patient Position: Sitting, Cuff Size: Normal)   Pulse 72   Temp 98.3 F (36.8 C) (Oral)   Ht 5\' 9"  (1.753 m)   Wt 201 lb 6.4 oz (91.4 kg)   SpO2 96%   BMI 29.74 kg/m    General: Well-nourished, well-developed in no acute distress.  Head: Normocephalic, atraumatic.   Eyes: Conjunctiva pink, no icterus. Mouth: Oropharyngeal mucosa moist and pink  Neck: Supple without thyromegaly, masses, or lymphadenopathy.  Lungs: Clear to auscultation bilaterally.  Heart: Regular rate and rhythm, no murmurs rubs or gallops.  Abdomen: Bowel sounds are normal,  nondistended, no hepatosplenomegaly or masses, no abdominal bruits or hernia, no rebound or guarding.  Mild epig tenderness Rectal: not performed Extremities: No lower extremity edema. No clubbing or deformities.  Neuro: Alert and oriented x 4 , grossly normal neurologically.  Skin: Warm and dry, no rash  or jaundice.   Psych: Alert and cooperative, normal mood and affect.  Labs   See hpi  Imaging Studies   No results found.  Assessment   Epigastric pain/GERD:postprandial symptoms. U/S with normal gallbladder. Recent flare of heartburn off PPI. He is using regular NSAIDs/ASA and is at increased risk of gastritis/ulcer. Recommend EGD. If EGD negative, then would consider CT imaging and/or HIDA.   Fatty liver: noted on recent u/s. No etoh use. He is unaware of his cholesterol levels. Should have this checked. Strive towards healthier weight, diet, and exercise.   Change in bowels: unclear etiology. Denies diarrhea. ?related to UGI sx. Will screen for celiac/IBD. Retrieve copy of last colonoscopy report.   The patient was found to have elevated blood pressure when vital signs were checked  in the office. The blood pressure was rechecked by the nursing staff and it was found be persistently elevated >140/90 mmHg. I personally advised to the patient to follow up closely with his PCP for hypertension control.   PLAN   EGD with Dr. Jena Gauss. ASA 2.  I have discussed the risks, alternatives, benefits with regards to but not limited to the risk of reaction to medication, bleeding, infection, perforation and the patient is agreeable to proceed. Written consent to be obtained. Complete labs.  Continue pantoprazole 40mg  daily. Limit NSAIDs/ASA. Yearly LFTs due to fatty liver, can have checked by PCP.  Consider cholesterol check with PCP. Discuss increasing BP medication with PCP.   Leanna Battles. Melvyn Neth, MHS, PA-C [facility]

## 2022-07-25 NOTE — Patient Instructions (Signed)
Please complete labs at your convenience. Upper endoscopy to be scheduled. See separate instructions. Continue pantoprazole 40mg  daily before breakfast. Limit ibuprofen or aspirin powder use as much as possible.  You will need to have yearly liver labs due to fatty liver. Have your PCP check your cholesterol as well.  Discuss increasing your blood pressure medication with your PCP.

## 2022-07-26 ENCOUNTER — Encounter: Payer: Self-pay | Admitting: *Deleted

## 2022-07-26 ENCOUNTER — Telehealth: Payer: Self-pay | Admitting: *Deleted

## 2022-07-26 LAB — C-REACTIVE PROTEIN: CRP: 2 mg/L (ref 0–10)

## 2022-07-26 NOTE — Telephone Encounter (Signed)
LMOVM to return call  EGD w/Dr.Rourk, asa 2

## 2022-07-29 LAB — IGA: IgA/Immunoglobulin A, Serum: 90 mg/dL (ref 90–386)

## 2022-07-29 LAB — SEDIMENTATION RATE: Sed Rate: 2 mm/hr (ref 0–15)

## 2022-07-29 LAB — TISSUE TRANSGLUTAMINASE, IGA: Transglutaminase IgA: <2 U/mL (ref 0–3)

## 2022-07-30 ENCOUNTER — Telehealth: Payer: Self-pay | Admitting: *Deleted

## 2022-07-30 NOTE — Telephone Encounter (Signed)
Tried calling spouses work # [phone] x 3 but line continues to be busy. LMOVM of cell # to return call

## 2022-07-30 NOTE — Telephone Encounter (Signed)
Pt's spouse Valentina Gu left vm wanting to have a sooner procedure appointment than 08/31/22.  LMTRC

## 2022-08-04 DIAGNOSIS — I10 Essential (primary) hypertension: Secondary | ICD-10-CM | POA: Diagnosis not present

## 2022-08-04 DIAGNOSIS — Z6829 Body mass index (BMI) 29.0-29.9, adult: Secondary | ICD-10-CM | POA: Diagnosis not present

## 2022-08-14 DIAGNOSIS — G4733 Obstructive sleep apnea (adult) (pediatric): Secondary | ICD-10-CM | POA: Diagnosis not present

## 2022-08-28 ENCOUNTER — Encounter (HOSPITAL_COMMUNITY): Payer: Self-pay

## 2022-08-28 ENCOUNTER — Encounter (HOSPITAL_COMMUNITY)
Admission: RE | Admit: 2022-08-28 | Discharge: 2022-08-28 | Disposition: A | Discharge status: Home / Self Care | Payer: BC Managed Care – PPO | Source: Ambulatory Visit | Attending: Internal Medicine | Admitting: Internal Medicine

## 2022-08-28 ENCOUNTER — Other Ambulatory Visit: Payer: Self-pay

## 2022-08-28 HISTORY — DX: Fatty (change of) liver, not elsewhere classified: K76.0

## 2022-08-28 HISTORY — DX: Sleep apnea, unspecified: G47.30

## 2022-08-28 HISTORY — DX: Personal history of urinary calculi: Z87.442

## 2022-08-31 ENCOUNTER — Other Ambulatory Visit: Payer: Self-pay

## 2022-08-31 ENCOUNTER — Ambulatory Visit (HOSPITAL_COMMUNITY): Payer: BC Managed Care – PPO | Admitting: Certified Registered"

## 2022-08-31 ENCOUNTER — Encounter (HOSPITAL_COMMUNITY): Payer: Self-pay | Admitting: Internal Medicine

## 2022-08-31 ENCOUNTER — Encounter (HOSPITAL_COMMUNITY)
Admission: RE | Disposition: A | Discharge status: Home / Self Care | Payer: Self-pay | Source: Home / Self Care | Attending: Internal Medicine

## 2022-08-31 ENCOUNTER — Ambulatory Visit (HOSPITAL_COMMUNITY)
Admission: RE | Admit: 2022-08-31 | Discharge: 2022-08-31 | Disposition: A | Discharge status: Home / Self Care | Payer: BC Managed Care – PPO | Attending: Internal Medicine | Admitting: Internal Medicine

## 2022-08-31 DIAGNOSIS — R1013 Epigastric pain: Secondary | ICD-10-CM | POA: Diagnosis not present

## 2022-08-31 DIAGNOSIS — K3189 Other diseases of stomach and duodenum: Secondary | ICD-10-CM | POA: Diagnosis not present

## 2022-08-31 DIAGNOSIS — I10 Essential (primary) hypertension: Secondary | ICD-10-CM | POA: Insufficient documentation

## 2022-08-31 DIAGNOSIS — Z79899 Other long term (current) drug therapy: Secondary | ICD-10-CM | POA: Insufficient documentation

## 2022-08-31 DIAGNOSIS — G4730 Sleep apnea, unspecified: Secondary | ICD-10-CM | POA: Diagnosis not present

## 2022-08-31 DIAGNOSIS — K219 Gastro-esophageal reflux disease without esophagitis: Secondary | ICD-10-CM | POA: Diagnosis not present

## 2022-08-31 HISTORY — PX: ESOPHAGOGASTRODUODENOSCOPY (EGD) WITH PROPOFOL: SHX5813

## 2022-08-31 HISTORY — PX: BIOPSY: SHX5522

## 2022-08-31 SURGERY — ESOPHAGOGASTRODUODENOSCOPY (EGD) WITH PROPOFOL
Anesthesia: General

## 2022-08-31 MED ORDER — LIDOCAINE HCL (PF) 2 % IJ SOLN
INTRAMUSCULAR | Status: AC
  Filled 2022-08-31: qty 5

## 2022-08-31 MED ORDER — PROPOFOL 500 MG/50ML IV EMUL
INTRAVENOUS | Status: AC
  Filled 2022-08-31: qty 50

## 2022-08-31 MED ORDER — PROPOFOL 10 MG/ML IV BOLUS
INTRAVENOUS | Status: DC | PRN
  Administered 2022-08-31: 100 mg via INTRAVENOUS

## 2022-08-31 MED ORDER — LIDOCAINE HCL (CARDIAC) PF 100 MG/5ML IV SOSY
PREFILLED_SYRINGE | INTRAVENOUS | Status: DC | PRN
  Administered 2022-08-31: 80 mg via INTRAVENOUS

## 2022-08-31 MED ORDER — PROPOFOL 500 MG/50ML IV EMUL
INTRAVENOUS | Status: DC | PRN
  Administered 2022-08-31: 150 ug/kg/min via INTRAVENOUS

## 2022-08-31 MED ORDER — STERILE WATER FOR IRRIGATION IR SOLN
Status: DC | PRN
  Administered 2022-08-31: 60 mL

## 2022-08-31 MED ORDER — LACTATED RINGERS IV SOLN: INTRAVENOUS | Status: DC

## 2022-08-31 NOTE — H&P (Signed)
@LOGO @   Primary Care Physician:  Richardean Chimera, MD Primary Gastroenterologist:  Dr.Tawonda Legaspi  abdominal pain much improved.  Denies dysphagia.  Pre-Procedure History & Physical: HPI:  Robert Hunter is a 47 y.o. male here for   Further evaluation of epigastric pain poorly controlled reflux.  Back on Protonix symptoms much improved.  He has developed healthy lifestyle since seen in the office dieting and has lost 18 pounds  Past Medical History:  Diagnosis Date   Fatty liver    GERD (gastroesophageal reflux disease)    History of kidney stones    Hypertension    Sleep apnea     Past Surgical History:  Procedure Laterality Date   LEG SURGERY Left     Prior to Admission medications   Medication Sig Start Date End Date Taking? Authorizing Provider  acetaminophen (TYLENOL) 500 MG tablet Take 1,000 mg by mouth every 4 (four) hours as needed for mild pain or fever (and/or body aches).   Yes [provider]  lisinopril (ZESTRIL) 5 MG tablet Take 5 mg by mouth daily. 06/30/19  Yes [provider]  pantoprazole (PROTONIX) 40 MG tablet Take 40 mg by mouth daily. 07/07/22  Yes [provider]    Allergies as of 07/26/2022 - Review Complete 07/25/2022  Allergen Reaction Noted   Iodine Rash 07/04/2015    Family History  Problem Relation Age of Onset   Liver disease Cousin    Cirrhosis Cousin        etoh, maternal   Colon cancer Other        paternal great uncle in his 33s   Cirrhosis Maternal Aunt        etoh   Inflammatory bowel disease Neg Hx    Celiac disease Neg Hx    Pancreatic disease Neg Hx     Social History   Socioeconomic History   Marital status: Married    Spouse name: Not on file   Number of children: Not on file   Years of education: Not on file   Highest education level: Not on file  Occupational History   Not on file  Tobacco Use   Smoking status: Never   Smokeless tobacco: Never  Vaping Use   Vaping Use: Never used  Substance  and Sexual Activity   Alcohol use: No   Drug use: No   Sexual activity: Yes  Other Topics Concern   Not on file  Social History Narrative   Not on file   Social Determinants of Health   Financial Resource Strain: Not on file  Food Insecurity: Not on file  Transportation Needs: Not on file  Physical Activity: Not on file  Stress: Not on file  Social Connections: Not on file  Intimate Partner Violence: Not on file    Review of Systems: See HPI, otherwise negative ROS  Physical Exam: BP (!) 143/100   Pulse 63   Temp 98.5 F (36.9 C) (Oral)   Resp 19   SpO2 98%  General:   Alert,  Well-developed, well-nourished, pleasant and cooperative in NAD Neck:  Supple; no masses or thyromegaly. No significant cervical adenopathy. Lungs:  Clear throughout to auscultation.   No wheezes, crackles, or rhonchi. No acute distress. Heart:  Regular rate and rhythm; no murmurs, clicks, rubs,  or gallops. Abdomen: Non-distended, normal bowel sounds.  Soft and nontender without appreciable mass or hepatosplenomegaly.  Pulses:  Normal pulses noted. Extremities:  Without clubbing or edema.  Impression/Plan:    47 year old gentleman  with recent epigastric pain and worsening reflux off PPI.  Now back on PPI weight loss and eating much better.  Symptoms globally improved.  Denies dysphagia.  I have offered the patient an an EGD today per plan.  The risks, benefits, limitations, alternatives and imponderables have been reviewed with the patient. Questions have been answered. All parties are agreeable.       Notice: This dictation was prepared with Dragon dictation along with smaller phrase technology. Any transcriptional errors that result from this process are unintentional and may not be corrected upon review.

## 2022-08-31 NOTE — Transfer of Care (Signed)
Immediate Anesthesia Transfer of Care Note  Patient: Robert Hunter  Procedure(s) Performed: ESOPHAGOGASTRODUODENOSCOPY (EGD) WITH PROPOFOL BIOPSY  Patient Location: PACU  Anesthesia Type:General  Level of Consciousness: drowsy and patient cooperative  Airway & Oxygen Therapy: Patient Spontanous Breathing and Patient connected to nasal cannula oxygen  Post-op Assessment: Report given to RN and Post -op Vital signs reviewed and stable  Post vital signs: Reviewed and stable  Last Vitals:  Vitals Value Taken Time  BP    Temp    Pulse    Resp    SpO2      Last Pain:  Vitals:   08/31/22 1051  TempSrc:   PainSc: 0-No pain         Complications: No notable events documented.

## 2022-08-31 NOTE — Anesthesia Preprocedure Evaluation (Signed)
Anesthesia Evaluation  Patient identified by MRN, date of birth, ID band Patient awake    Reviewed: Allergy & Precautions, H&P , NPO status , Patient's Chart, lab work & pertinent test results, reviewed documented beta blocker date and time   Airway Mallampati: II  TM Distance: >3 FB Neck ROM: full    Dental no notable dental hx.    Pulmonary neg pulmonary ROS, sleep apnea    Pulmonary exam normal breath sounds clear to auscultation       Cardiovascular Exercise Tolerance: Good hypertension, negative cardio ROS  Rhythm:regular Rate:Normal     Neuro/Psych negative neurological ROS  negative psych ROS   GI/Hepatic negative GI ROS, Neg liver ROS,GERD  ,,  Endo/Other  negative endocrine ROS    Renal/GU negative Renal ROS  negative genitourinary   Musculoskeletal   Abdominal   Peds  Hematology negative hematology ROS (+)   Anesthesia Other Findings   Reproductive/Obstetrics negative OB ROS                             Anesthesia Physical Anesthesia Plan  ASA: 2  Anesthesia Plan: General   Post-op Pain Management:    Induction:   PONV Risk Score and Plan: Propofol infusion  Airway Management Planned:   Additional Equipment:   Intra-op Plan:   Post-operative Plan:   Informed Consent: I have reviewed the patients History and Physical, chart, labs and discussed the procedure including the risks, benefits and alternatives for the proposed anesthesia with the patient or authorized representative who has indicated his/her understanding and acceptance.     Dental Advisory Given  Plan Discussed with: CRNA  Anesthesia Plan Comments:        Anesthesia Quick Evaluation

## 2022-08-31 NOTE — Discharge Instructions (Signed)
EGD Discharge instructions Please read the instructions outlined below and refer to this sheet in the next few weeks. These discharge instructions provide you with general information on caring for yourself after you leave the hospital. Your doctor may also give you specific instructions. While your treatment has been planned according to the most current medical practices available, unavoidable complications occasionally occur. If you have any problems or questions after discharge, please call your doctor. ACTIVITY You may resume your regular activity but move at a slower pace for the next 24 hours.  Take frequent rest periods for the next 24 hours.  Walking will help expel (get rid of) the air and reduce the bloated feeling in your abdomen.  No driving for 24 hours (because of the anesthesia (medicine) used during the test).  You may shower.  Do not sign any important legal documents or operate any machinery for 24 hours (because of the anesthesia used during the test).  NUTRITION Drink plenty of fluids.  You may resume your normal diet.  Begin with a light meal and progress to your normal diet.  Avoid alcoholic beverages for 24 hours or as instructed by your caregiver.  MEDICATIONS You may resume your normal medications unless your caregiver tells you otherwise.  WHAT YOU CAN EXPECT TODAY You may experience abdominal discomfort such as a feeling of fullness or "gas" pains.  FOLLOW-UP Your doctor will discuss the results of your test with you.  SEEK IMMEDIATE MEDICAL ATTENTION IF ANY OF THE FOLLOWING OCCUR: Excessive nausea (feeling sick to your stomach) and/or vomiting.  Severe abdominal pain and distention (swelling).  Trouble swallowing.  Temperature over 101 F (37.8 C).  Rectal bleeding or vomiting of blood.      Stomach appeared mildly inflamed.  Biopsies taken.  Otherwise, your upper GI tract appeared normal  Continue Protonix 40 mg daily   further recommendations to follow  pending review of pathology report   GERD information provided  Office visit with Tana Coast in 8 weeks   at patient request, call Daishon Chui at [phone] -  reviewed findings and recommendations

## 2022-08-31 NOTE — Anesthesia Postprocedure Evaluation (Signed)
Anesthesia Post Note  Patient: Robert Hunter  Procedure(s) Performed: ESOPHAGOGASTRODUODENOSCOPY (EGD) WITH PROPOFOL BIOPSY  Patient location during evaluation: Phase II Anesthesia Type: General Level of consciousness: awake Pain management: pain level controlled Vital Signs Assessment: post-procedure vital signs reviewed and stable Respiratory status: spontaneous breathing and respiratory function stable Cardiovascular status: blood pressure returned to baseline and stable Postop Assessment: no headache and no apparent nausea or vomiting Anesthetic complications: no Comments: Late entry   No notable events documented.   Last Vitals:  Vitals:   08/31/22 1018 08/31/22 1106  BP: (!) 143/100 94/72  Pulse: 63 78  Resp: 19 (!) 22  Temp: 36.9 C 36.6 C  SpO2: 98% 99%    Last Pain:  Vitals:   08/31/22 1106  TempSrc: Oral  PainSc: 0-No pain                 [name]

## 2022-08-31 NOTE — Op Note (Signed)
[facility] Patient Name: Robert Hunter Procedure Date: 08/31/2022 10:31 AM MRN: [id number] Date of Birth: Jul 09, 1975 Attending MD: Gennette Pac , MD, [id number] CSN: [id number] Age: 47 Admit Type: Outpatient Procedure:                Upper GI endoscopy Indications:              Epigastric abdominal pain Providers:                Gennette Pac, MD, Angelica Ran, Zena Amos Referring MD:              Medicines:                Propofol per Anesthesia Complications:            No immediate complications. Estimated Blood Loss:     Estimated blood loss was minimal. Procedure:                Pre-Anesthesia Assessment:                           - Prior to the procedure, a History and Physical                            was performed, and patient medications and                            allergies were reviewed. The patient's tolerance of                            previous anesthesia was also reviewed. The risks                            and benefits of the procedure and the sedation                            options and risks were discussed with the patient.                            All questions were answered, and informed consent                            was obtained. Prior Anticoagulants: The patient has                            taken no anticoagulant or antiplatelet agents. ASA                            Grade Assessment: II - A patient with mild systemic                            disease. After reviewing the risks and benefits,  the patient was deemed in satisfactory condition to                            undergo the procedure.                           After obtaining informed consent, the endoscope was                            passed under direct vision. Throughout the                            procedure, the patient's blood pressure, pulse, and                            oxygen saturations  were monitored continuously. The                            [id number]-H190 ([id number]) scope was introduced through the                            mouth, and advanced to the second part of duodenum.                            The upper GI endoscopy was accomplished without                            difficulty. The patient tolerated the procedure                            well. Scope In: 10:55:59 AM Scope Out: 11:01:01 AM Total Procedure Duration: 0 hours 5 minutes 2 seconds  Findings:      The examined esophagus was normal.      Patchy moderately erythematous mucosa was found in the gastric antrum.      The duodenal bulb and second portion of the duodenum were normal. Status       post antral biopsy. Impression:               - Normal esophagus.                           - Erythematous mucosa in the antrum. Biopsies taken.                           - Normal duodenal bulb and second portion of the                            duodenum. Moderate Sedation:      Moderate (conscious) sedation was personally administered by an       anesthesia professional. The following parameters were monitored: oxygen       saturation, heart rate, blood pressure, respiratory rate, EKG, adequacy       of pulmonary ventilation, and response to care. Recommendation:           - Patient has a contact number available for  emergencies. The signs and symptoms of potential                            delayed complications were discussed with the                            patient. Return to normal activities tomorrow.                            Written discharge instructions were provided to the                            patient.                           - Advance diet as tolerated.                           - Continue present medications.                           - Await pathology results.                           - Return to my office in 8 weeks. Procedure Code(s):        ---  Professional ---                           [id number], Esophagogastroduodenoscopy, flexible,                            transoral; diagnostic, including collection of                            specimen(s) by brushing or washing, when performed                            (separate procedure) Diagnosis Code(s):        --- Professional ---                           K31.89, Other diseases of stomach and duodenum                           R10.13, Epigastric pain CPT copyright 2022 American Medical Association. All rights reserved. The codes documented in this report are preliminary and upon coder review may  be revised to meet current compliance requirements. [name]. Robert Lewing, MD Gennette Pac, MD 08/31/2022 11:09:49 AM This report has been signed electronically. Number of Addenda: 0

## 2022-09-03 ENCOUNTER — Encounter: Payer: Self-pay | Admitting: Internal Medicine

## 2022-09-03 LAB — SURGICAL PATHOLOGY

## 2022-09-09 ENCOUNTER — Encounter (HOSPITAL_COMMUNITY): Payer: Self-pay | Admitting: Internal Medicine

## 2022-09-14 DIAGNOSIS — G4733 Obstructive sleep apnea (adult) (pediatric): Secondary | ICD-10-CM | POA: Diagnosis not present

## 2022-09-19 ENCOUNTER — Telehealth: Payer: Self-pay | Admitting: Gastroenterology

## 2022-09-19 ENCOUNTER — Encounter: Payer: Self-pay | Admitting: Gastroenterology

## 2022-09-19 NOTE — Telephone Encounter (Signed)
Please let pt know we reviewed copy of colonoscopy report from 2013. He is due for colonoscopy at any time for screening. Recent EGD, per recommendations dr Jena Gauss advised ov in 8 weeks. Please arrange.

## 2022-09-20 NOTE — Telephone Encounter (Signed)
Pt's wife was made aware (DPR on file) appt was scheduled.

## 2022-10-14 DIAGNOSIS — G4733 Obstructive sleep apnea (adult) (pediatric): Secondary | ICD-10-CM | POA: Diagnosis not present

## 2022-11-05 ENCOUNTER — Ambulatory Visit: Payer: BC Managed Care – PPO | Admitting: Gastroenterology

## 2022-11-05 ENCOUNTER — Encounter: Payer: Self-pay | Admitting: Gastroenterology

## 2023-01-29 ENCOUNTER — Other Ambulatory Visit: Payer: Self-pay

## 2023-01-29 ENCOUNTER — Encounter (HOSPITAL_BASED_OUTPATIENT_CLINIC_OR_DEPARTMENT_OTHER): Payer: Self-pay | Admitting: Emergency Medicine

## 2023-01-29 ENCOUNTER — Emergency Department (HOSPITAL_BASED_OUTPATIENT_CLINIC_OR_DEPARTMENT_OTHER)
Admission: EM | Admit: 2023-01-29 | Discharge: 2023-01-29 | Disposition: A | Discharge status: Home / Self Care | Payer: BC Managed Care – PPO | Attending: Emergency Medicine | Admitting: Emergency Medicine

## 2023-01-29 ENCOUNTER — Emergency Department (HOSPITAL_BASED_OUTPATIENT_CLINIC_OR_DEPARTMENT_OTHER): Payer: BC Managed Care – PPO

## 2023-01-29 DIAGNOSIS — R109 Unspecified abdominal pain: Secondary | ICD-10-CM | POA: Insufficient documentation

## 2023-01-29 DIAGNOSIS — R110 Nausea: Secondary | ICD-10-CM | POA: Diagnosis not present

## 2023-01-29 DIAGNOSIS — K820 Obstruction of gallbladder: Secondary | ICD-10-CM | POA: Diagnosis not present

## 2023-01-29 DIAGNOSIS — R1031 Right lower quadrant pain: Secondary | ICD-10-CM | POA: Diagnosis not present

## 2023-01-29 DIAGNOSIS — K5730 Diverticulosis of large intestine without perforation or abscess without bleeding: Secondary | ICD-10-CM | POA: Diagnosis not present

## 2023-01-29 LAB — CBC
HCT: 44.7 % (ref 39.0–52.0)
Hemoglobin: 14.8 g/dL (ref 13.0–17.0)
MCH: 29 pg (ref 26.0–34.0)
MCHC: 33.1 g/dL (ref 30.0–36.0)
MCV: 87.6 fL (ref 80.0–100.0)
Platelets: 207 10*3/uL (ref 150–400)
RBC: 5.1 MIL/uL (ref 4.22–5.81)
RDW: 12.7 % (ref 11.5–15.5)
WBC: 5.7 10*3/uL (ref 4.0–10.5)
nRBC: 0 % (ref 0.0–0.2)

## 2023-01-29 LAB — URINALYSIS, ROUTINE W REFLEX MICROSCOPIC
Bacteria, UA: NONE SEEN
Bilirubin Urine: NEGATIVE
Glucose, UA: NEGATIVE mg/dL
Ketones, ur: NEGATIVE mg/dL
Leukocytes,Ua: NEGATIVE
Nitrite: NEGATIVE
Specific Gravity, Urine: 1.024 (ref 1.005–1.030)
pH: 8 (ref 5.0–8.0)

## 2023-01-29 LAB — BASIC METABOLIC PANEL
Anion gap: 6 (ref 5–15)
BUN: 16 mg/dL (ref 6–20)
CO2: 31 mmol/L (ref 22–32)
Calcium: 9.5 mg/dL (ref 8.9–10.3)
Chloride: 105 mmol/L (ref 98–111)
Creatinine, Ser: 0.95 mg/dL (ref 0.61–1.24)
GFR, Estimated: >60 mL/min (ref >60)
Glucose, Bld: 78 mg/dL (ref 70–99)
Potassium: 3.5 mmol/L (ref 3.5–5.1)
Sodium: 142 mmol/L (ref 135–145)

## 2023-01-29 NOTE — ED Provider Notes (Signed)
[place] EMERGENCY DEPARTMENT AT [facility] Provider Note   CSN: [id number] Arrival date & time: 01/29/23  1517     History {Add pertinent medical, surgical, social history, OB history to HPI:1} Chief Complaint  Patient presents with   Flank Pain    Robert Hunter is a 47 y.o. male.  47 year old male with a history of kidney stones who presents to the emergency department right flank pain.  Patient reports that shortly after lunch she started experiencing a soreness in his right flank.  Says that it abruptly became more severe and radiated down to his right lower quadrant.  Started feeling nauseous but no vomiting.  No fevers.  No dysuria or frequency.  Was 10/10 in severity.  Felt very similar to prior kidney stone.  No abdominal surgeries.  Says that the pain went away just prior to coming to the emergency department.       Home Medications Prior to Admission medications   Medication Sig Start Date End Date Taking? Authorizing Provider  acetaminophen (TYLENOL) 500 MG tablet Take 1,000 mg by mouth every 4 (four) hours as needed for mild pain or fever (and/or body aches).    [provider]  lisinopril (ZESTRIL) 5 MG tablet Take 5 mg by mouth daily. 06/30/19   [provider]  pantoprazole (PROTONIX) 40 MG tablet Take 40 mg by mouth daily. 07/07/22   [provider]      Allergies    Iodine    Review of Systems   Review of Systems  Physical Exam Updated Vital Signs BP (!) 141/86 (BP Location: Right Arm)   Pulse (!) 56   Temp 98.2 F (36.8 C)   Resp 17   SpO2 100%  Physical Exam Vitals and nursing note reviewed.  Constitutional:      General: He is not in acute distress.    Appearance: He is well-developed.  HENT:     Head: Normocephalic and atraumatic.     Right Ear: External ear normal.     Left Ear: External ear normal.     Nose: Nose normal.  Eyes:     Extraocular Movements: Extraocular movements intact.      Conjunctiva/sclera: Conjunctivae normal.     Pupils: Pupils are equal, round, and reactive to light.  Pulmonary:     Effort: Pulmonary effort is normal. No respiratory distress.  Abdominal:     General: There is no distension.     Palpations: Abdomen is soft. There is no mass.     Tenderness: There is no abdominal tenderness. There is no right CVA tenderness, left CVA tenderness or guarding.     Comments: Negative Murphy sign  Musculoskeletal:     Cervical back: Normal range of motion and neck supple.     Right lower leg: No edema.     Left lower leg: No edema.  Skin:    General: Skin is warm and dry.  Neurological:     Mental Status: He is alert. Mental status is at baseline.  Psychiatric:        Mood and Affect: Mood normal.        Behavior: Behavior normal.     ED Results / Procedures / Treatments   Labs (all labs ordered are listed, but only abnormal results are displayed) Labs Reviewed  URINALYSIS, ROUTINE W REFLEX MICROSCOPIC - Abnormal; Notable for the following components:      Result Value   Hgb urine dipstick SMALL (*)    Protein, ur  TRACE (*)    All other components within normal limits  BASIC METABOLIC PANEL  CBC    EKG None  Radiology No results found.  Procedures Procedures  {Document cardiac monitor, telemetry assessment procedure when appropriate:1}  EMERGENCY DEPARTMENT BILIARY ULTRASOUND INTERPRETATION "Study: Limited Abdominal Ultrasound of the Gallbladder and Common Bile Duct."  INDICATIONS: Abdominal pain Indication: Multiple views of the gallbladder and common bile duct were obtained in real-time with a Multi-frequency probe."  PERFORMED BY:  Myself IMAGES ARCHIVED?: No LIMITATIONS: None INTERPRETATION:  Negative sonographic Murphy sign, no pericholecystic fluid, biliary sludge present, no gallstones   Medications Ordered in ED Medications - No data to display  ED Course/ Medical Decision Making/ A&P   {   Click here for ABCD2,  HEART and other calculatorsREFRESH Note before signing :1}                              Medical Decision Making Amount and/or Complexity of Data Reviewed Labs: ordered. Radiology: ordered.   ***  {Document critical care time when appropriate:1} {Document review of labs and clinical decision tools ie heart score, Chads2Vasc2 etc:1}  {Document your independent review of radiology images, and any outside records:1} {Document your discussion with family members, caretakers, and with consultants:1} {Document social determinants of health affecting pt's care:1} {Document your decision making why or why not admission, treatments were needed:1} Final Clinical Impression(s) / ED Diagnoses Final diagnoses:  None    Rx / DC Orders ED Discharge Orders     None

## 2023-01-29 NOTE — Discharge Instructions (Signed)
Follow-up with your primary doctor in several days  Return to the emergency department for any concerning symptoms

## 2023-01-29 NOTE — ED Notes (Signed)
Discharge instructions reviewed with patient. Patient questions answered and opportunity for education reviewed. Patient voices understanding of discharge instructions with no further questions. Patient ambulatory with steady gait to lobby.  

## 2023-01-29 NOTE — ED Triage Notes (Signed)
Right side back pain Intense sharp, episode lasted about 30 minutes. Resolved on on. Some difficulty with urination noticed yesterday. Seen at clinic at work  Hx of kidney stone. "Feels similar"

## 2023-02-18 DIAGNOSIS — G4733 Obstructive sleep apnea (adult) (pediatric): Secondary | ICD-10-CM | POA: Diagnosis not present

## 2023-03-30 DIAGNOSIS — K921 Melena: Secondary | ICD-10-CM | POA: Diagnosis not present

## 2023-03-30 DIAGNOSIS — K649 Unspecified hemorrhoids: Secondary | ICD-10-CM | POA: Diagnosis not present

## 2023-03-30 DIAGNOSIS — Z6827 Body mass index (BMI) 27.0-27.9, adult: Secondary | ICD-10-CM | POA: Diagnosis not present

## 2023-04-09 ENCOUNTER — Encounter: Payer: Self-pay | Admitting: Gastroenterology

## 2023-04-09 ENCOUNTER — Ambulatory Visit: Payer: BC Managed Care – PPO | Admitting: Gastroenterology

## 2023-04-30 NOTE — Progress Notes (Signed)
 GI Office Note    Referring Provider: Lari Elspeth BRAVO, MD Primary Care Physician:  Lari Elspeth BRAVO, MD  Primary Gastroenterologist: Ozell Hollingshead, MD   Chief Complaint   Chief Complaint  Patient presents with   Hemorrhoids    Pt complains of hemorrhoids itching and burning since December. Has tried several OTC creams and wipes. Change in bowel habits and mid abd pain off and on.    History of Present Illness   Robert Hunter is a 48 y.o. male presenting today for follow-up.  Last seen in May 2024.  History of epigastric pain, GERD, fatty liver, change in bowel habits.  Presents today at the request of Dr. Lari for rectal bleeding due to hemorrhoids, due for colonoscopy.  Last colonoscopy at [facility] April 2013.  Today:  Since before Christmas having a lot of hemorrhoid issues. Not sure what flared it. Has had a lot of itching. Some pain. Some brbpr on the toilet tissue. Using a lot of hemorrhoid creams, suppositories, wipes, has spent over $150 trying to get relief. Using Dulcolax one daily at times because sometimes feels like he gets backed up.  Believes it is just a stool softener.  Does not seem to do much at times.  Has a bowel movement most days.  Sometimes stools can be loose.  On those days he has 2 to 3/day.  No nocturnal stools.  Often waking up at night due to perianal itching.  Also anal itching.  He felt like perianal itching became worse the more he used creams.  Try stopping a day or 2 but symptoms seem to get worse.  He is not sure if he has a rash.  Notes that he dropped about 30 pounds after he was seen last year.  His reflux symptoms got a whole lot better.  Over the holidays he is gained about 15 pounds back and is noted some return of his reflux and intermittent postprandial epigastric pain.  Feels like it is food related.  Has been able to come off of this PPI.  Notes that he also was able to minimize his blood pressure medication although currently  taking 5 mg lisinopril daily.  He was advised by his PCP that he can take up to 10 mg daily if needed to manage his blood pressure.  He has a blood pressure cuff at home but has not used it recently.  He plans to start checking daily, increase his lisinopril to 10 mg daily until better controlled.  Going back on your diet.     Wt Readings from Last 3 Encounters:  05/01/23 186 lb 6.4 oz (84.6 kg)  08/28/22 201 lb 8 oz (91.4 kg)  07/25/22 201 lb 6.4 oz (91.4 kg)   Abdominal ultrasound July 13, 2022: Hepatic steatosis.   CT renal stone protocol November 2024: IMPRESSION: 1. Colonic diverticulosis.  EGD June 2024: -Normal esophagus -Erythematous mucosa in the antrum status post biopsy, no H. pylori -Normal duodenal bulb and second portion of duodenum  May 2024, CRP, sed rate negative, celiac serologies negative.  Medications   Current Outpatient Medications  Medication Sig Dispense Refill   acetaminophen  (TYLENOL ) 500 MG tablet Take 1,000 mg by mouth every 4 (four) hours as needed for mild pain or fever (and/or body aches).     lisinopril (ZESTRIL) 5 MG tablet Take 5 mg by mouth daily.     No current facility-administered medications for this visit.    Allergies   Allergies as  of 05/01/2023 - Review Complete 05/01/2023  Allergen Reaction Noted   Iodine Rash 07/04/2015     Past Medical History   Past Medical History:  Diagnosis Date   Fatty liver    GERD (gastroesophageal reflux disease)    History of kidney stones    Hypertension    Sleep apnea     Past Surgical History   Past Surgical History:  Procedure Laterality Date   BIOPSY  08/31/2022   Procedure: BIOPSY;  Surgeon: Shaaron Lamar HERO, MD;  Location: [facility];  Service: Endoscopy;;   COLONOSCOPY  2013   normal   ESOPHAGOGASTRODUODENOSCOPY (EGD) WITH PROPOFOL  N/A 08/31/2022   Procedure: ESOPHAGOGASTRODUODENOSCOPY (EGD) WITH PROPOFOL ;  Surgeon: Shaaron Lamar HERO, MD;  Location: [facility];  Service:  Endoscopy;  Laterality: N/A;  12:30 PM, asa 2   LEG SURGERY Left     Past Family History   Family History  Problem Relation Age of Onset   Liver disease Cousin    Cirrhosis Cousin        etoh, maternal   Colon cancer Other        paternal great uncle in his 36s   Cirrhosis Maternal Aunt        etoh   Inflammatory bowel disease Neg Hx    Celiac disease Neg Hx    Pancreatic disease Neg Hx     Past Social History   Social History   Socioeconomic History   Marital status: Married    Spouse name: Not on file   Number of children: Not on file   Years of education: Not on file   Highest education level: Not on file  Occupational History   Not on file  Tobacco Use   Smoking status: Never   Smokeless tobacco: Never  Vaping Use   Vaping status: Never Used  Substance and Sexual Activity   Alcohol use: No   Drug use: No   Sexual activity: Yes  Other Topics Concern   Not on file  Social History Narrative   Not on file   Social Drivers of Health   Financial Resource Strain: Not on file  Food Insecurity: Not on file  Transportation Needs: Not on file  Physical Activity: Not on file  Stress: Not on file (01/30/2023)  Social Connections: Not on file  Intimate Partner Violence: Not on file    Review of Systems   General: Negative for anorexia, weight loss, fever, chills, fatigue, weakness. ENT: Negative for hoarseness, difficulty swallowing , nasal congestion. CV: Negative for chest pain, angina, palpitations, dyspnea on exertion, peripheral edema.  Respiratory: Negative for dyspnea at rest, dyspnea on exertion, cough, sputum, wheezing.  GI: See history of present illness. GU:  Negative for dysuria, hematuria, urinary incontinence, urinary frequency, nocturnal urination.  Endo: Negative for unusual weight change.     Physical Exam   BP (!) 162/112   Pulse 76   Temp 98.6 F (37 C)   Ht 5' 9 (1.753 m)   Wt 186 lb 6.4 oz (84.6 kg)   BMI 27.53 kg/m    General:  Well-nourished, well-developed in no acute distress.  Eyes: No icterus. Mouth: Oropharyngeal mucosa moist and pink   Lungs: Clear to auscultation bilaterally.  Heart: Regular rate and rhythm, no murmurs rubs or gallops.  Abdomen: Bowel sounds are normal, nontender, nondistended, no hepatosplenomegaly or masses,  no abdominal bruits or hernia , no rebound or guarding.  Rectal: Rash noted perianally, likely candidiasis versus normal.  Mild  tenderness with DRE, no masses or stool.  No blood. Extremities: No lower extremity edema. No clubbing or deformities. Neuro: Alert and oriented x 4   Skin: Warm and dry, no jaundice.   Psych: Alert and cooperative, normal mood and affect.  Labs   Lab Results  Component Value Date   WBC 5.7 01/29/2023   HGB 14.8 01/29/2023   HCT 44.7 01/29/2023   MCV 87.6 01/29/2023   PLT 207 01/29/2023   Lab Results  Component Value Date   NA 142 01/29/2023   CL 105 01/29/2023   K 3.5 01/29/2023   CO2 31 01/29/2023   BUN 16 01/29/2023   CREATININE 0.95 01/29/2023   GFRNONAA >60 01/29/2023   CALCIUM 9.5 01/29/2023   ALBUMIN 4.5 09/16/2017   GLUCOSE 78 01/29/2023     Lab Results  Component Value Date   CRP 2 07/25/2022   Lab Results  Component Value Date   ESRSEDRATE 2 07/25/2022    Imaging Studies   No results found.  Assessment/plan   Anal itching/hemorrhoids: -Suspected internal hemorrhoids on exam -Complains of intense itching, mild pain, toilet tissue hematochezia since December -Optimize bowel function, stools are fairly regular, sometimes more firm/hard increase docusate sodium to 200 mg daily -Can continue hydrocortisone cream internally twice daily -Hemorrhoids to be evaluated at time of upcoming colonoscopy, he will likely be a good banding candidate.  Perianal itching: -Demarcated rash noted, likely yeast/fungal -Ketoconazole  2% cream apply at bedtime for 2 to 3 weeks until rash resolved, continue for at least 2 days after  resolution -Keep area otherwise dry throughout the day  Screening colonoscopy: -ASA 2.  I have discussed the risks, alternatives, benefits with regards to but not limited to the risk of reaction to medication, bleeding, infection, perforation and the patient is agreeable to proceed. Written consent to be obtained.  Hypertension: -patient to increase his lisinopril to 10mg  as previously instructed by PCP -monitor BP at home, reach out to PCP if ongoing elevation  Fatty liver: -continue dietary changes, daily exercise, weight reduction -monitor liver labs with PCP yearly -NAFLD fibrosis score -2.37, F0-F2. -follow up in two years  Sonny RAMAN. Ezzard, MHS, PA-C [facility]

## 2023-05-01 ENCOUNTER — Other Ambulatory Visit: Payer: Self-pay | Admitting: *Deleted

## 2023-05-01 ENCOUNTER — Telehealth: Payer: Self-pay | Admitting: Gastroenterology

## 2023-05-01 ENCOUNTER — Encounter: Payer: Self-pay | Admitting: Gastroenterology

## 2023-05-01 ENCOUNTER — Ambulatory Visit (INDEPENDENT_AMBULATORY_CARE_PROVIDER_SITE_OTHER): Payer: BC Managed Care – PPO | Admitting: Gastroenterology

## 2023-05-01 ENCOUNTER — Other Ambulatory Visit: Payer: Self-pay | Admitting: Gastroenterology

## 2023-05-01 ENCOUNTER — Encounter: Payer: Self-pay | Admitting: *Deleted

## 2023-05-01 VITALS — BP 162/112 | HR 76 | Temp 98.6°F | Ht 69.0 in | Wt 186.4 lb

## 2023-05-01 DIAGNOSIS — K760 Fatty (change of) liver, not elsewhere classified: Secondary | ICD-10-CM | POA: Diagnosis not present

## 2023-05-01 DIAGNOSIS — K649 Unspecified hemorrhoids: Secondary | ICD-10-CM | POA: Diagnosis not present

## 2023-05-01 DIAGNOSIS — Z1211 Encounter for screening for malignant neoplasm of colon: Secondary | ICD-10-CM | POA: Insufficient documentation

## 2023-05-01 DIAGNOSIS — L290 Pruritus ani: Secondary | ICD-10-CM | POA: Diagnosis not present

## 2023-05-01 MED ORDER — KETOCONAZOLE 2 % EX CREA: 1.0000 | TOPICAL_CREAM | Freq: Every day | CUTANEOUS | 0 refills | Status: DC

## 2023-05-01 MED ORDER — KETOCONAZOLE 2 % EX CREA: TOPICAL_CREAM | CUTANEOUS | 0 refills | Status: AC

## 2023-05-01 MED ORDER — NA SULFATE-K SULFATE-MG SULF 17.5-3.13-1.6 GM/177ML PO SOLN: 1.0000 | Freq: Once | ORAL | 0 refills | Status: AC

## 2023-05-01 NOTE — Telephone Encounter (Signed)
 Please NIC for follow up for fatty liver in 2 years

## 2023-05-01 NOTE — Patient Instructions (Addendum)
 Colonoscopy to be scheduled. See separate instructions.  Apply ketoconazole  cream to the perianal area (externally only) for itching. Use at bedtime for 2-3 weeks, continue at least for two days after rash resolves.   Check your dulcolax product at home. If it is docusate sodium 100mg , you can take two at one time daily. If it is something different, please let me know.

## 2023-05-27 ENCOUNTER — Telehealth: Payer: Self-pay | Admitting: *Deleted

## 2023-05-27 NOTE — Telephone Encounter (Signed)
 Spouse called in. Unable to do procedure Monday with Dr. Jena Gauss due to work conflict. She is going to see when patient can reschedule and let me know.

## 2023-06-03 ENCOUNTER — Ambulatory Visit (HOSPITAL_COMMUNITY)
Admission: RE | Admit: 2023-06-03 | Payer: BC Managed Care – PPO | Source: Home / Self Care | Admitting: Internal Medicine

## 2023-06-03 ENCOUNTER — Encounter (HOSPITAL_COMMUNITY): Admission: RE | Payer: Self-pay | Source: Home / Self Care

## 2023-06-03 SURGERY — COLONOSCOPY WITH PROPOFOL
Anesthesia: Choice

## 2023-06-06 DIAGNOSIS — N410 Acute prostatitis: Secondary | ICD-10-CM | POA: Diagnosis not present

## 2023-06-06 DIAGNOSIS — R350 Frequency of micturition: Secondary | ICD-10-CM | POA: Diagnosis not present

## 2023-07-14 DIAGNOSIS — H6692 Otitis media, unspecified, left ear: Secondary | ICD-10-CM | POA: Diagnosis not present
